# Patient Record
Sex: Male | Born: 1985 | Race: White | Hispanic: No | Marital: Single | Smoking: Never smoker
Health system: Southern US, Community
[De-identification: ages and names within clinical notes are randomized; demographics above are authoritative.]

## PROBLEM LIST (undated history)

## (undated) ENCOUNTER — Ambulatory Visit (HOSPITAL_COMMUNITY): Admission: EM | Payer: BC Managed Care – PPO

## (undated) DIAGNOSIS — N390 Urinary tract infection, site not specified: Secondary | ICD-10-CM

## (undated) DIAGNOSIS — A048 Other specified bacterial intestinal infections: Secondary | ICD-10-CM

## (undated) HISTORY — DX: Urinary tract infection, site not specified: N39.0

## (undated) HISTORY — DX: Other specified bacterial intestinal infections: A04.8

---

## 2018-06-12 ENCOUNTER — Encounter: Payer: Self-pay | Admitting: Urgent Care

## 2018-06-12 ENCOUNTER — Ambulatory Visit (INDEPENDENT_AMBULATORY_CARE_PROVIDER_SITE_OTHER): Payer: 59 | Admitting: Urgent Care

## 2018-06-12 VITALS — BP 151/87 | HR 62 | Temp 98.1°F | Resp 18 | Ht 73.0 in | Wt 215.4 lb

## 2018-06-12 DIAGNOSIS — R0789 Other chest pain: Secondary | ICD-10-CM

## 2018-06-12 DIAGNOSIS — R030 Elevated blood-pressure reading, without diagnosis of hypertension: Secondary | ICD-10-CM | POA: Diagnosis not present

## 2018-06-12 MED ORDER — OMEPRAZOLE 20 MG PO CPDR
20.0000 mg | DELAYED_RELEASE_CAPSULE | Freq: Two times a day (BID) | ORAL | 1 refills | Status: DC
Start: 2018-06-12 — End: 2018-08-22

## 2018-06-12 MED ORDER — RANITIDINE HCL 150 MG PO TABS: 150.0000 mg | ORAL_TABLET | Freq: Two times a day (BID) | ORAL | 0 refills | Status: DC

## 2018-06-12 NOTE — Progress Notes (Signed)
   MRN: [id number] DOB: 12/26/1985  Subjective:   Manuel Smith is a 32 y.o. male presenting for 2 day history of mid-sternal chest pain after laying down. Has been constant, pressure type sensation, warmth but not burning. Does have difficulty taking a deep breath due to pain and headache, difficulty sleeping due to his chest symptoms. Patient moved here from [place] last summer, reports a history of UTI only. Denies smoking cigarettes or drinking alcohol. Denies fever, cough, shob, heart racing, palpitations, n/v, abdominal pain, rashes. Hydrates very well.   Manuel Smith is not currently taking any medications.  Also has No Known Allergies.  Manuel Smith  has a past medical history of UTI (urinary tract infection). Denies past surgical history. Denies family history of heart disease, HTN, HL, abnormal heart rhythms.  Objective:   Vitals: BP (!) 151/87   Pulse 62   Temp 98.1 F (36.7 C) (Oral)   Resp 18   Ht 6\' 1"  (1.854 m)   Wt 215 lb 6.4 oz (97.7 kg)   SpO2 99%   BMI 28.42 kg/m   Physical Exam  Constitutional: He is oriented to person, place, and time. He appears well-developed and well-nourished.  HENT:  Mouth/Throat: Oropharynx is clear and moist.  Eyes: Pupils are equal, round, and reactive to light. EOM are normal. No scleral icterus.  Neck: Normal range of motion. Neck supple. No thyromegaly present.  Cardiovascular: Normal rate, regular rhythm, normal heart sounds and intact distal pulses. Exam reveals no gallop and no friction rub.  No murmur heard. Pulmonary/Chest: Effort normal and breath sounds normal. No stridor. No respiratory distress. He has no wheezes. He has no rales. He exhibits no tenderness.  Abdominal: Soft. Bowel sounds are normal. He exhibits no distension and no mass. There is no tenderness. There is no rebound and no guarding.  Neurological: He is alert and oriented to person, place, and time.  Skin: Skin is warm and dry.  Psychiatric: He has a normal mood and  affect.   ECG interpretation - sinus rhythm at 69bpm.  Assessment and Plan :   Chest tightness - Plan: EKG 12-Lead, CBC, Comprehensive metabolic panel, CANCELED: COMPLETE METABOLIC PANEL WITH GFR, CANCELED: Lipid panel  Atypical chest pain - Plan: H. pylori breath test, TSH  Elevated blood pressure reading  Suspect GERD versus PUD, H. pylori infection. Labs pending. Will have patient start Zantac with Prilosec. Will f/u with the results and treatment plan thereafter. He denies history of HTN, will recheck in 2 weeks.   Manuel BambergMario Rayshad Riviello, PA-C [facility] [phone] 06/12/2018  5:26 PM

## 2018-06-12 NOTE — Patient Instructions (Addendum)
We will start with Zantac and Prilosec together for now. Please sign up for mychart so we can explain in detail what to do once we have the test results back.     Nonspecific Chest Pain Chest pain can be caused by many different conditions. There is always a chance that your pain could be related to something serious, such as a heart attack or a blood clot in your lungs. Chest pain can also be caused by conditions that are not life-threatening. If you have chest pain, it is very important to follow up with your health care provider. What are the causes? Causes of this condition include:  Heartburn.  Pneumonia or bronchitis.  Anxiety or stress.  Inflammation around your heart (pericarditis) or lung (pleuritis or pleurisy).  A blood clot in your lung.  A collapsed lung (pneumothorax). This can develop suddenly on its own (spontaneous pneumothorax) or from trauma to the chest.  Shingles infection (varicella-zoster virus).  Heart attack.  Damage to the bones, muscles, and cartilage that make up your chest wall. This can include: ? Bruised bones due to injury. ? Strained muscles or cartilage due to frequent or repeated coughing or overwork. ? Fracture to one or more ribs. ? Sore cartilage due to inflammation (costochondritis).  What increases the risk? Risk factors for this condition may include:  Activities that increase your risk for trauma or injury to your chest.  Respiratory infections or conditions that cause frequent coughing.  Medical conditions or overeating that can cause heartburn.  Heart disease or family history of heart disease.  Conditions or health behaviors that increase your risk of developing a blood clot.  Having had chicken pox (varicella zoster).  What are the signs or symptoms? Chest pain can feel like:  Burning or tingling on the surface of your chest or deep in your chest.  Crushing, pressure, aching, or squeezing pain.  Dull or sharp pain that  is worse when you move, cough, or take a deep breath.  Pain that is also felt in your back, neck, shoulder, or arm, or pain that spreads to any of these areas.  Your chest pain may come and go, or it may stay constant. How is this diagnosed? Lab tests or other studies may be needed to find the cause of your pain. Your health care provider may have you take a test called an ECG (electrocardiogram). An ECG records your heartbeat patterns at the time the test is performed. You may also have other tests, such as:  Transthoracic echocardiogram (TTE). In this test, sound waves are used to create a picture of the heart structures and to look at how blood flows through your heart.  Transesophageal echocardiogram (TEE).This is a more advanced imaging test that takes images from inside your body. It allows your health care provider to see your heart in finer detail.  Cardiac monitoring. This allows your health care provider to monitor your heart rate and rhythm in real time.  Holter monitor. This is a portable device that records your heartbeat and can help to diagnose abnormal heartbeats. It allows your health care provider to track your heart activity for several days, if needed.  Stress tests. These can be done through exercise or by taking medicine that makes your heart beat more quickly.  Blood tests.  Other imaging tests.  How is this treated? Treatment depends on what is causing your chest pain. Treatment may include:  Medicines. These may include: ? Acid blockers for heartburn. ? Anti-inflammatory  medicine. ? Pain medicine for inflammatory conditions. ? Antibiotic medicine, if an infection is present. ? Medicines to dissolve blood clots. ? Medicines to treat coronary artery disease (CAD).  Supportive care for conditions that do not require medicines. This may include: ? Resting. ? Applying heat or cold packs to injured areas. ? Limiting activities until pain decreases.  Follow  these instructions at home: Medicines  If you were prescribed an antibiotic, take it as told by your health care provider. Do not stop taking the antibiotic even if you start to feel better.  Take over-the-counter and prescription medicines only as told by your health care provider. Lifestyle  Do not use any products that contain nicotine or tobacco, such as cigarettes and e-cigarettes. If you need help quitting, ask your health care provider.  Do not drink alcohol.  Make lifestyle changes as directed by your health care provider. These may include: ? Getting regular exercise. Ask your health care provider to suggest some activities that are safe for you. ? Eating a heart-healthy diet. A registered dietitian can help you to learn healthy eating options. ? Maintaining a healthy weight. ? Managing diabetes, if necessary. ? Reducing stress, such as with yoga or relaxation techniques. General instructions  Avoid any activities that bring on chest pain.  If heartburn is the cause for your chest pain, raise (elevate) the head of your bed about 6 inches (15 cm) by putting blocks under the legs. Sleeping with more pillows does not effectively relieve heartburn because it only changes the position of your head.  Keep all follow-up visits as told by your health care provider. This is important. This includes any further testing if your chest pain does not go away. Contact a health care provider if:  Your chest pain does not go away.  You have a rash with blisters on your chest.  You have a fever.  You have chills. Get help right away if:  Your chest pain is worse.  You have a cough that gets worse, or you cough up blood.  You have severe pain in your abdomen.  You have severe weakness.  You faint.  You have sudden, unexplained chest discomfort.  You have sudden, unexplained discomfort in your arms, back, neck, or jaw.  You have shortness of breath at any time.  You suddenly  start to sweat, or your skin gets clammy.  You feel nauseous or you vomit.  You suddenly feel light-headed or dizzy.  Your heart begins to beat quickly, or it feels like it is skipping beats. These symptoms may represent a serious problem that is an emergency. Do not wait to see if the symptoms will go away. Get medical help right away. Call your local emergency services (911 in the [place] Do not drive yourself to the hospital. This information is not intended to replace advice given to you by your health care provider. Make sure you discuss any questions you have with your health care provider. Document Released: 08/24/2005 Document Revised: 08/08/2016 Document Reviewed: 08/08/2016 Elsevier Interactive Patient Education  2017 [employer]    IF you received an x-ray today, you will receive an invoice from [facility]. Please contact [facility] at [phone] with questions or concerns regarding your invoice.   IF you received labwork today, you will receive an invoice from [facility]. Please contact [facility] at [phone] with questions or concerns regarding your invoice.   Our billing staff will not be able to assist you with questions regarding bills from  these companies.  You will be contacted with the lab results as soon as they are available. The fastest way to get your results is to activate your My Chart account. Instructions are located on the last page of this paperwork. If you have not heard from [place] regarding the results in 2 weeks, please contact this office.

## 2018-06-13 LAB — CBC
Hematocrit: 44.1 % (ref 37.5–51.0)
Hemoglobin: 15 g/dL (ref 13.0–17.7)
MCH: 30.7 pg (ref 26.6–33.0)
MCHC: 34 g/dL (ref 31.5–35.7)
MCV: 90 fL (ref 79–97)
PLATELETS: 243 10*3/uL (ref 150–450)
RBC: 4.88 x10E6/uL (ref 4.14–5.80)
RDW: 13.9 % (ref 12.3–15.4)
WBC: 6.6 10*3/uL (ref 3.4–10.8)

## 2018-06-13 LAB — COMPREHENSIVE METABOLIC PANEL
ALK PHOS: 79 IU/L (ref 39–117)
ALT: 25 IU/L (ref 0–44)
AST: 25 IU/L (ref 0–40)
Albumin/Globulin Ratio: 1.6 (ref 1.2–2.2)
Albumin: 4.5 g/dL (ref 3.5–5.5)
BUN/Creatinine Ratio: 12 (ref 9–20)
BUN: 13 mg/dL (ref 6–20)
Bilirubin Total: 0.4 mg/dL (ref 0.0–1.2)
CO2: 24 mmol/L (ref 20–29)
CREATININE: 1.11 mg/dL (ref 0.76–1.27)
Calcium: 9.3 mg/dL (ref 8.7–10.2)
Chloride: 103 mmol/L (ref 96–106)
GFR calc Af Amer: 101 mL/min/{1.73_m2} (ref >59)
GFR calc non Af Amer: 87 mL/min/{1.73_m2} (ref >59)
GLOBULIN, TOTAL: 2.8 g/dL (ref 1.5–4.5)
Glucose: 92 mg/dL (ref 65–99)
Potassium: 4.2 mmol/L (ref 3.5–5.2)
SODIUM: 144 mmol/L (ref 134–144)
Total Protein: 7.3 g/dL (ref 6.0–8.5)

## 2018-06-13 LAB — H. PYLORI BREATH COLLECTION

## 2018-06-13 LAB — H. PYLORI BREATH TEST: H PYLORI BREATH TEST: POSITIVE — AB

## 2018-06-13 LAB — TSH: TSH: 3.56 u[IU]/mL (ref 0.450–4.500)

## 2018-06-15 ENCOUNTER — Ambulatory Visit: Payer: Self-pay

## 2018-06-15 ENCOUNTER — Ambulatory Visit (INDEPENDENT_AMBULATORY_CARE_PROVIDER_SITE_OTHER): Payer: 59 | Admitting: Urgent Care

## 2018-06-15 ENCOUNTER — Other Ambulatory Visit: Payer: Self-pay

## 2018-06-15 ENCOUNTER — Other Ambulatory Visit: Payer: Self-pay | Admitting: Urgent Care

## 2018-06-15 ENCOUNTER — Encounter: Payer: Self-pay | Admitting: Urgent Care

## 2018-06-15 VITALS — BP 146/74 | HR 83 | Temp 98.4°F | Ht 76.0 in | Wt 212.8 lb

## 2018-06-15 DIAGNOSIS — A048 Other specified bacterial intestinal infections: Secondary | ICD-10-CM | POA: Diagnosis not present

## 2018-06-15 DIAGNOSIS — R030 Elevated blood-pressure reading, without diagnosis of hypertension: Secondary | ICD-10-CM

## 2018-06-15 DIAGNOSIS — R0789 Other chest pain: Secondary | ICD-10-CM | POA: Insufficient documentation

## 2018-06-15 MED ORDER — AMOXICILLIN 500 MG PO CAPS: 1000.0000 mg | ORAL_CAPSULE | Freq: Two times a day (BID) | ORAL | 0 refills | Status: DC

## 2018-06-15 MED ORDER — CLARITHROMYCIN 500 MG PO TABS: 500.0000 mg | ORAL_TABLET | Freq: Two times a day (BID) | ORAL | 0 refills | Status: DC

## 2018-06-15 NOTE — Progress Notes (Signed)
    MRN: [id number] DOB: [birth year]  Subjective:   Manuel Smith is a 32 y.o. male presenting for follow up on his chest pain.  Last office visit was 06/12/2018.  Patient had H. pylori testing done which turned out to be positive.  I sent patient a my chart message recommend he start H. pylori eradication treatment with amoxicillin, clarithromycin and Prilosec.  He has not started this medication.  He is very concerned that his heart is the issue.  All his labs were normal including an EKG from 06/12/2018.  Manuel Smith has a current medication list which includes the following prescription(s): amoxicillin, clarithromycin, omeprazole, and ranitidine. Also has No Known Allergies.  Manuel Smith  has a past medical history of UTI (urinary tract infection). Denies past surgical history.  Objective:   Vitals: BP (!) 146/74 (BP Location: Left Arm, Patient Position: Sitting, Cuff Size: Normal)   Pulse 83   Temp 98.4 F (36.9 C) (Oral)   Ht 6\' 4"  (1.93 m)   Wt 212 lb 12.8 oz (96.5 kg)   SpO2 98%   BMI 25.90 kg/m   BP Readings from Last 3 Encounters:  06/15/18 (!) 146/74  06/12/18 (!) 151/87    Physical Exam  Constitutional: He is oriented to person, place, and time. He appears well-developed and well-nourished.  Cardiovascular: Normal rate.  Pulmonary/Chest: Effort normal.  Neurological: He is alert and oriented to person, place, and time.   Assessment and Plan :   H. pylori infection  Chest tightness  Atypical chest pain  Elevated blood pressure reading  I reviewed all labs with patient again in clinic including his EKG.  Everything was normal except for H. pylori.  Counseled patient that this very well could be the source of his chest discomfort.  I recommended he actually start the eradication treatment with amoxicillin and clarithromycin.  Patient was agreeable to this.  We will follow-up with patient in 4 weeks to see if his symptoms are resolved, consider retesting for H. pylori or  further work-up as needed. Counseled patient on potential for adverse effects with medications prescribed today, patient verbalized understanding.   Wallis BambergMario Jamaris Theard, PA-C [facility] [phone] 06/15/2018 5:18 PM

## 2018-06-15 NOTE — Patient Instructions (Addendum)
In order for us to retest to see if you are healed from your h. Pylori infection, you have to be off of Prilosec for a total of 2 weeks. This can be difficult because Prilosec does help with heartburn and stopping it can make heartburn come back.    Helicobacter Pylori Infection Helicobacter pylori infection is an infection in the stomach that is caused by the Helicobacter pylori (H. pylori) bacteria. This type of bacteria often lives in the lining of the stomach. The infection can cause ulcers and irritation (gastritis) in some people. It is the most common cause of ulcers in the stomach (gastric ulcer) and in the upper part of the intestine (duodenal ulcer). Having this infection may also increase the risk of stomach cancer and a type of white blood cell cancer (lymphoma) that affects the stomach. What are the causes? H. pylori is a type of bacteria that is often found in the stomachs of healthy people. The bacteria may be passed from person to person through contact with stool or saliva. It is not known why some people develop ulcers, gastritis, or cancer from the infection. What increases the risk? This condition is more likely to develop in people who:  Have family members with the infection.  Live with many other people, such as in a dormitory.  Are of African, Hispanic, or Asian descent.  What are the signs or symptoms? Most people with this infection do not have symptoms. If you do have symptoms, they may include:  Heartburn.  Stomach pain.  Nausea.  Vomiting.  Blood-tinged vomit.  Loss of appetite.  Bad breath.  How is this diagnosed? This condition may be diagnosed based on your symptoms, a physical exam, and various tests. Tests may include:  Blood tests or stool tests to check for the proteins (antibodies) that your body may produce in response to the bacteria. These tests are the best way to confirm the diagnosis.  A breath test to check for the type of gas that the  H. pylori bacteria release after breaking down a substance called urea. For the test, you are asked to drink urea. This test is often done after treatment in order to find out if the treatment worked.  A procedure in which a thin, flexible tube with a tiny camera at the end is placed into your stomach and upper intestine (upper endoscopy). Your health care provider may also take tissue samples (biopsy) to test for H. pylori and cancer.  How is this treated? Treatment for this condition usually involves taking a combination of medicines (triple therapy) for a couple of weeks. Triple therapy includes one medicine to reduce the acid in your stomach and two types of antibiotic medicines. Many drug combinations have been approved for treatment. Treatment usually kills the H. pylori and reduces your risk of cancer. You may need to be tested for H. pylori again after treatment. In some cases, the treatment may need to be repeated. Follow these instructions at home:  Take over-the-counter and prescription medicines only as told by your health care provider.  Take your antibiotics as told by your health care provider. Do not stop taking the antibiotics even if you start to feel better.  You can do all your usual activities and eat what you usually do.  Take steps to prevent future infections: ? Wash your hands often. ? Make sure the food you eat has been properly prepared. ? Drink water only from clean sources.  Keep all follow-up visits  as told by your health care provider. This is important. Contact a health care provider if:  Your symptoms do not get better.  Your symptoms return after treatment. This information is not intended to replace advice given to you by your health care provider. Make sure you discuss any questions you have with your health care provider. Document Released: 03/07/2016 Document Revised: 04/21/2016 Document Reviewed: 11/26/2014 Elsevier Interactive Patient Education   2018 [employer].     IF you received an x-ray today, you will receive an invoice from [facility]. Please contact [facility] at [phone] with questions or concerns regarding your invoice.   IF you received labwork today, you will receive an invoice from [facility]. Please contact [facility] at [phone] with questions or concerns regarding your invoice.   Our billing staff will not be able to assist you with questions regarding bills from these companies.  You will be contacted with the lab results as soon as they are available. The fastest way to get your results is to activate your My Chart account. Instructions are located on the last page of this paperwork. If you have not heard from [place] regarding the results in 2 weeks, please contact this office.

## 2018-06-15 NOTE — Telephone Encounter (Signed)
Patient called in and says he was seen on 06/12/18 for chest pain and he is still having the same type of chest pain, nothing different. I asked what made him call in today, he says because he's concerned that it's not better and it makes him feel tired and sleepy. I asked is he taking the medication prescribed on 06/12/18, he says yes.  He says he would like to be seen this afternoon, if possible, to talk to the provider more about what is going on. Appointment scheduled for today at 1700 with Manuel BambergMario Mani, PA.

## 2018-06-18 ENCOUNTER — Encounter: Payer: Self-pay | Admitting: Urgent Care

## 2018-06-18 MED ORDER — AMLODIPINE BESYLATE 5 MG PO TABS: 5.0000 mg | ORAL_TABLET | Freq: Every day | ORAL | 3 refills | Status: DC

## 2018-06-18 NOTE — Telephone Encounter (Signed)
Please advise, thank you.

## 2018-06-18 NOTE — Telephone Encounter (Signed)
Please send this message to [facility] Surgical[facility]ario Smith.

## 2018-06-18 NOTE — Telephone Encounter (Signed)
BP Readings from Last 3 Encounters:  06/15/18 (!) 146/74  06/12/18 (!) 151/87   We will have patient start a low-dose of amlodipine at 5 mg once daily.

## 2018-06-18 NOTE — Telephone Encounter (Signed)
Please let patient know that we can repeat his ecg, get a chest x-ray, order more tests if he would like to do this. I do not mind look for other sources of his chest pain. He would just have to set up another office visit. Let me know what patient would like or simply redirect him toward the scheduling pool if he wants to look at other sources of his chest pain.   Thank you!

## 2018-06-18 NOTE — Telephone Encounter (Signed)
Will communicate with patient through mychart.

## 2018-06-21 ENCOUNTER — Ambulatory Visit (INDEPENDENT_AMBULATORY_CARE_PROVIDER_SITE_OTHER): Payer: 59

## 2018-06-21 ENCOUNTER — Ambulatory Visit (INDEPENDENT_AMBULATORY_CARE_PROVIDER_SITE_OTHER): Payer: 59 | Admitting: Urgent Care

## 2018-06-21 ENCOUNTER — Encounter: Payer: Self-pay | Admitting: Urgent Care

## 2018-06-21 ENCOUNTER — Telehealth: Payer: Self-pay | Admitting: Urgent Care

## 2018-06-21 VITALS — BP 121/74 | HR 106 | Temp 98.6°F | Resp 16 | Ht 76.0 in | Wt 209.0 lb

## 2018-06-21 DIAGNOSIS — R079 Chest pain, unspecified: Secondary | ICD-10-CM | POA: Diagnosis not present

## 2018-06-21 DIAGNOSIS — R000 Tachycardia, unspecified: Secondary | ICD-10-CM | POA: Diagnosis not present

## 2018-06-21 DIAGNOSIS — R0789 Other chest pain: Secondary | ICD-10-CM

## 2018-06-21 DIAGNOSIS — G4700 Insomnia, unspecified: Secondary | ICD-10-CM | POA: Diagnosis not present

## 2018-06-21 LAB — TROPONIN I: Troponin I: <0.01 ng/mL (ref 0.00–0.04)

## 2018-06-21 LAB — D-DIMER, QUANTITATIVE: D-DIMER: 0.22 mg/L FEU (ref 0.00–0.49)

## 2018-06-21 MED ORDER — HYDROXYZINE HCL 25 MG PO TABS: 25.0000 mg | ORAL_TABLET | Freq: Every evening | ORAL | 0 refills | Status: DC | PRN

## 2018-06-21 NOTE — Telephone Encounter (Signed)
Returning call Copied from CRM [phone]. Topic: Quick Communication - Lab Results >> Jun 21, 2018  3:00 PM Cleophus MoltGoodwin, Tasha K, CMA wrote: Called patient to inform them of  lab results. When patient returns call, triage nurse may disclose results as follows: (do not send a letter) let him know that his d-dimer test was completely normal and given his risk factors is highly unlikely to be undergoing any chest clot as a source of his pain.  His troponin marker was also negative and this together with a couple of EKGs that have been completely normal means that he does not have a heart attack.  If you would like me to refer him to a cardiologist, please let me know and I will gladly do so.  But I think it would be best to refer him to a gastroenterologist if his symptoms are persisting despite treatment for H. pylori.

## 2018-06-21 NOTE — Progress Notes (Signed)
MRN: [id number] DOB: December 21, 1985  Subjective:   Manuel Smith is a 32 y.o. male presenting for follow up on persistent epigastric to midsternal chest pain.  Patient has had this for several weeks now.  Reports that it is getting worse including feeling like he cannot take a deep breath, does not let him sleep at night and feels heart racing.  He is currently taking medications for H. pylori eradication.  We have also started him on amlodipine for managing his high blood pressure.  Denies fever, radiation of his chest pain, nausea, vomiting, diaphoresis, palpitations, dizziness, shortness of breath.  Patient is not a smoker.  He does not drink strong caffeine drinks.  He limits his coffee to one cup per day.  He is very worried about his heart.  Denies smoking cigarettes.  Manuel Smith has a current medication list which includes the following prescription(s): amlodipine, amoxicillin, clarithromycin, omeprazole, and ranitidine. Also has No Known Allergies.  Manuel Smith  has a past medical history of UTI (urinary tract infection). Denies past surgical history. Denies family history of cancer, diabetes, HTN, HL, heart disease, stroke, mental illness.   Objective:   Vitals: BP 121/74   Pulse (!) 106   Temp 98.6 F (37 C)   Resp 16   Ht 6\' 4"  (1.93 m)   Wt 209 lb (94.8 kg)   SpO2 99%   BMI 25.44 kg/m   BP Readings from Last 3 Encounters:  06/21/18 121/74  06/15/18 (!) 146/74  06/12/18 (!) 151/87    Physical Exam  Constitutional: He is oriented to person, place, and time. He appears well-developed and well-nourished.  HENT:  Mouth/Throat: Oropharynx is clear and moist.  Eyes: Right eye exhibits no discharge. Left eye exhibits no discharge. No scleral icterus.  Neck: Normal range of motion. Neck supple. No thyromegaly present.  Cardiovascular: Normal rate, regular rhythm, normal heart sounds and intact distal pulses. Exam reveals no gallop and no friction rub.  No murmur  heard. Pulmonary/Chest: Effort normal and breath sounds normal. No stridor. No respiratory distress. He has no wheezes. He has no rales. He exhibits no tenderness.  Neurological: He is alert and oriented to person, place, and time.  Skin: Skin is warm and dry.  Psychiatric:  Patient appears very anxious about his symptoms.   Dg Chest 2 View  Result Date: 06/21/2018 CLINICAL DATA:  32 year old male with chest pain.  Tachycardia. EXAM: CHEST - 2 VIEW COMPARISON:  None. FINDINGS: Lung volumes are compatible with good inspiratory effort. Normal cardiac size and mediastinal contours. Visualized tracheal air column is within normal limits. Both lungs appear clear. No pneumothorax or pleural effusion. No osseous abnormality identified. Negative visible bowel gas pattern. IMPRESSION: Negative. Electronically Signed   By: Odessa Fleming M.D.   On: 06/21/2018 11:15   ECG interpretation - sinus rhythm at 99bpm.  Assessment and Plan :   Atypical chest pain - Plan: DG Chest 2 View, EKG 12-Lead, Sedimentation Rate, D-dimer, quantitative (not at [facility]), Troponin I, Uric A+ANA+RA Qn+CRP+ASO  Chest tightness  Racing heart beat  Insomnia, unspecified type  Radiology report, EKG and physical exam findings very reassuring.  Labs pending.  Encourage patient to maintain his H. pylori treatment.  Will restart Zantac in case Prilosec is not helping with any acid reflux component.  Will start Vistaril to help patient with his sleeping and anxiety.  Consider referral to GI for an endoscopy if symptoms persist and lab tests are negative.  Wallis Bamberg, PA-C [facility] [phone] 06/21/2018 11:00 AM

## 2018-06-21 NOTE — Addendum Note (Signed)
Addended by: Rogelia RohrerMCADOO, Emerson Barretto K on: 06/21/2018 05:44 PM   Modules accepted: Orders

## 2018-06-21 NOTE — Patient Instructions (Addendum)
Helicobacter Pylori Infection Helicobacter pylori infection is an infection in the stomach that is caused by the Helicobacter pylori (H. pylori) bacteria. This type of bacteria often lives in the lining of the stomach. The infection can cause ulcers and irritation (gastritis) in some people. It is the most common cause of ulcers in the stomach (gastric ulcer) and in the upper part of the intestine (duodenal ulcer). Having this infection may also increase the risk of stomach cancer and a type of white blood cell cancer (lymphoma) that affects the stomach. What are the causes? H. pylori is a type of bacteria that is often found in the stomachs of healthy people. The bacteria may be passed from person to person through contact with stool or saliva. It is not known why some people develop ulcers, gastritis, or cancer from the infection. What increases the risk? This condition is more likely to develop in people who:  Have family members with the infection.  Live with many other people, such as in a dormitory.  Are of African, Hispanic, or Asian descent.  What are the signs or symptoms? Most people with this infection do not have symptoms. If you do have symptoms, they may include:  Heartburn.  Stomach pain.  Nausea.  Vomiting.  Blood-tinged vomit.  Loss of appetite.  Bad breath.  How is this diagnosed? This condition may be diagnosed based on your symptoms, a physical exam, and various tests. Tests may include:  Blood tests or stool tests to check for the proteins (antibodies) that your body may produce in response to the bacteria. These tests are the best way to confirm the diagnosis.  A breath test to check for the type of gas that the H. pylori bacteria release after breaking down a substance called urea. For the test, you are asked to drink urea. This test is often done after treatment in order to find out if the treatment worked.  A procedure in which a thin, flexible tube with  a tiny camera at the end is placed into your stomach and upper intestine (upper endoscopy). Your health care provider may also take tissue samples (biopsy) to test for H. pylori and cancer.  How is this treated? Treatment for this condition usually involves taking a combination of medicines (triple therapy) for a couple of weeks. Triple therapy includes one medicine to reduce the acid in your stomach and two types of antibiotic medicines. Many drug combinations have been approved for treatment. Treatment usually kills the H. pylori and reduces your risk of cancer. You may need to be tested for H. pylori again after treatment. In some cases, the treatment may need to be repeated. Follow these instructions at home:  Take over-the-counter and prescription medicines only as told by your health care provider.  Take your antibiotics as told by your health care provider. Do not stop taking the antibiotics even if you start to feel better.  You can do all your usual activities and eat what you usually do.  Take steps to prevent future infections: ? Wash your hands often. ? Make sure the food you eat has been properly prepared. ? Drink water only from clean sources.  Keep all follow-up visits as told by your health care provider. This is important. Contact a health care provider if:  Your symptoms do not get better.  Your symptoms return after treatment. This information is not intended to replace advice given to you by your health care provider. Make sure you discuss any questions   you have with your health care provider. Document Released: 03/07/2016 Document Revised: 04/21/2016 Document Reviewed: 11/26/2014 Elsevier Interactive Patient Education  2018 Elsevier Inc.     IF you received an x-ray today, you will receive an invoice from [facility]. Please contact [facility] at [phone] with questions or concerns regarding your invoice.   IF you received labwork today,  you will receive an invoice from [facility]. Please contact [facility] at [phone] with questions or concerns regarding your invoice.   Our billing staff will not be able to assist you with questions regarding bills from these companies.  You will be contacted with the lab results as soon as they are available. The fastest way to get your results is to activate your My Chart account. Instructions are located on the last page of this paperwork. If you have not heard from us regarding the results in 2 weeks, please contact this office.      

## 2018-06-22 LAB — URIC A+ANA+RA QN+CRP+ASO
ANA: NEGATIVE
ASO: 342 [IU]/mL — AB (ref 0.0–200.0)
URIC ACID: 5 mg/dL (ref 3.7–8.6)

## 2018-06-22 LAB — SEDIMENTATION RATE: SED RATE: 2 mm/h (ref 0–15)

## 2018-06-22 NOTE — Telephone Encounter (Signed)
Information sent to patient via mychart from provider.  See result note.

## 2018-06-26 ENCOUNTER — Ambulatory Visit: Payer: 59 | Admitting: Urgent Care

## 2018-07-13 ENCOUNTER — Ambulatory Visit: Payer: 59 | Admitting: Emergency Medicine

## 2018-08-22 ENCOUNTER — Encounter: Payer: Self-pay | Admitting: Family Medicine

## 2018-08-22 ENCOUNTER — Other Ambulatory Visit: Payer: Self-pay

## 2018-08-22 ENCOUNTER — Ambulatory Visit (INDEPENDENT_AMBULATORY_CARE_PROVIDER_SITE_OTHER): Payer: 59 | Admitting: Family Medicine

## 2018-08-22 VITALS — BP 125/80 | HR 75 | Temp 98.7°F | Ht 77.5 in | Wt 205.8 lb

## 2018-08-22 DIAGNOSIS — Z1329 Encounter for screening for other suspected endocrine disorder: Secondary | ICD-10-CM | POA: Diagnosis not present

## 2018-08-22 DIAGNOSIS — Z13220 Encounter for screening for lipoid disorders: Secondary | ICD-10-CM

## 2018-08-22 DIAGNOSIS — Z23 Encounter for immunization: Secondary | ICD-10-CM

## 2018-08-22 DIAGNOSIS — Z114 Encounter for screening for human immunodeficiency virus [HIV]: Secondary | ICD-10-CM | POA: Diagnosis not present

## 2018-08-22 DIAGNOSIS — Z0000 Encounter for general adult medical examination without abnormal findings: Secondary | ICD-10-CM

## 2018-08-22 DIAGNOSIS — Z130 Encounter for screening for diseases of the blood and blood-forming organs and certain disorders involving the immune mechanism: Secondary | ICD-10-CM

## 2018-08-22 DIAGNOSIS — Z13228 Encounter for screening for other metabolic disorders: Secondary | ICD-10-CM

## 2018-08-22 NOTE — Patient Instructions (Addendum)
   If you have lab work done today you will be contacted with your lab results within the next 2 weeks.  If you have not heard from us then please contact us. The fastest way to get your results is to register for My Chart.   IF you received an x-ray today, you will receive an invoice from [facility]. Please contact [facility] at [phone] with questions or concerns regarding your invoice.   IF you received labwork today, you will receive an invoice from [facility]. Please contact [facility] at [phone] with questions or concerns regarding your invoice.   Our billing staff will not be able to assist you with questions regarding bills from these companies.  You will be contacted with the lab results as soon as they are available. The fastest way to get your results is to activate your My Chart account. Instructions are located on the last page of this paperwork. If you have not heard from us regarding the results in 2 weeks, please contact this office.     Preventive Care 18-39 Years, Male Preventive care refers to lifestyle choices and visits with your health care provider that can promote health and wellness. What does preventive care include?  A yearly physical exam. This is also called an annual well check.  Dental exams once or twice a year.  Routine eye exams. Ask your health care provider how often you should have your eyes checked.  Personal lifestyle choices, including: ? Daily care of your teeth and gums. ? Regular physical activity. ? Eating a healthy diet. ? Avoiding tobacco and drug use. ? Limiting alcohol use. ? Practicing safe sex. What happens during an annual well check? The services and screenings done by your health care provider during your annual well check will depend on your age, overall health, lifestyle risk factors, and family history of disease. Counseling Your health care provider may ask you questions about your:  Alcohol  use.  Tobacco use.  Drug use.  Emotional well-being.  Home and relationship well-being.  Sexual activity.  Eating habits.  Work and work environment.  Screening You may have the following tests or measurements:  Height, weight, and BMI.  Blood pressure.  Lipid and cholesterol levels. These may be checked every 5 years starting at age 20.  Diabetes screening. This is done by checking your blood sugar (glucose) after you have not eaten for a while (fasting).  Skin check.  Hepatitis C blood test.  Hepatitis B blood test.  Sexually transmitted disease (STD) testing.  Discuss your test results, treatment options, and if necessary, the need for more tests with your health care provider. Vaccines Your health care provider may recommend certain vaccines, such as:  Influenza vaccine. This is recommended every year.  Tetanus, diphtheria, and acellular pertussis (Tdap, Td) vaccine. You may need a Td booster every 10 years.  Varicella vaccine. You may need this if you have not been vaccinated.  HPV vaccine. If you are 26 or younger, you may need three doses over 6 months.  Measles, mumps, and rubella (MMR) vaccine. You may need at least one dose of MMR.You may also need a second dose.  Pneumococcal 13-valent conjugate (PCV13) vaccine. You may need this if you have certain conditions and have not been vaccinated.  Pneumococcal polysaccharide (PPSV23) vaccine. You may need one or two doses if you smoke cigarettes or if you have certain conditions.  Meningococcal vaccine. One dose is recommended if you are age 19-21 years   and a first-year college student living in a residence hall, or if you have one of several medical conditions. You may also need additional booster doses.  Hepatitis A vaccine. You may need this if you have certain conditions or if you travel or work in places where you may be exposed to hepatitis A.  Hepatitis B vaccine. You may need this if you have  certain conditions or if you travel or work in places where you may be exposed to hepatitis B.  Haemophilus influenzae type b (Hib) vaccine. You may need this if you have certain risk factors.  Talk to your health care provider about which screenings and vaccines you need and how often you need them. This information is not intended to replace advice given to you by your health care provider. Make sure you discuss any questions you have with your health care provider. Document Released: 01/10/2002 Document Revised: 08/03/2016 Document Reviewed: 09/15/2015 Elsevier Interactive Patient Education  2018 Elsevier Inc.  

## 2018-08-22 NOTE — Progress Notes (Signed)
9/25/20199:43 AM  Manuel Smith 1986/11/19, 32 y.o. male [id number]  Chief Complaint  Patient presents with  . Annual Exam    for medical insurance purposes    HPI:   Patient is a 32 y.o. 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 No No No     Depression screen [facility] 2/9 08/22/2018 06/21/2018 06/15/2018  Decreased Interest 0 2 0  Down, Depressed, Hopeless 0 1 0  PHQ - 2 Score 0 3 0  Altered sleeping - 3 -  Tired, decreased energy - 3 -  Change in appetite - 2 -  Feeling bad or failure about yourself  - 1 -  Trouble concentrating - 1 -  Moving slowly or fidgety/restless - 2 -  Suicidal thoughts - 1 -  PHQ-9 Score - 16 -  Difficult doing work/chores - Somewhat difficult -    No Known Allergies  Prior to Admission medications   Medication Sig Start Date End Date Taking? Authorizing Provider    Past Medical History:  Diagnosis Date  . H. pylori infection   . UTI (urinary tract infection)     History reviewed. No pertinent surgical history.  Social History   Tobacco Use  . Smoking status: Never Smoker  . Smokeless tobacco: Never Used  Substance Use Topics  . Alcohol use: Never    Frequency: Never    Family History  Problem Relation Age of Onset  . Cancer Mother     Review of Systems  Constitutional: Negative for chills and fever.  Respiratory: Negative for cough and shortness of  breath.   Cardiovascular: Negative for chest pain, palpitations and leg swelling.  Gastrointestinal: Negative for abdominal pain, nausea and vomiting.  All other systems reviewed and are negative.    OBJECTIVE:  Blood pressure 125/80, pulse 75, temperature 98.7 F (37.1 C), temperature source Oral, height 6' 5.5" (1.969 m), weight 205 lb 12.8 oz (93.4 kg), SpO2 97 %. Body mass index is 24.09 kg/m.    Visual Acuity Screening   Right eye Left eye Both eyes  Without correction: 20/13 20/13 20/13   With correction:       Physical Exam  Constitutional: He is oriented to person, place, and time. He appears well-developed and well-nourished.  HENT:  Head: Normocephalic and atraumatic.  Right Ear: Hearing, tympanic membrane, external ear and ear canal normal.  Left Ear: Hearing, tympanic membrane, external ear and ear canal normal.  Mouth/Throat: Oropharynx is clear and moist. No oropharyngeal exudate.  Eyes: Pupils are equal, round, and reactive to light. Conjunctivae and EOM are normal.  Neck: Neck supple. No thyromegaly present.  Cardiovascular: Normal rate, regular rhythm, normal heart sounds and intact distal pulses. Exam reveals no gallop and no friction rub.  No murmur heard. Pulmonary/Chest: Effort normal and breath sounds normal. He has no wheezes. He has no rhonchi. He  has no rales.  Abdominal: Soft. Bowel sounds are normal. He exhibits no distension and no mass. There is no tenderness.  Musculoskeletal: Normal range of motion. He exhibits no edema.  Lymphadenopathy:    He has no cervical adenopathy.  Neurological: He is alert and oriented to person, place, and time. He has normal strength and normal reflexes. No cranial nerve deficit. Coordination and gait normal.  Skin: Skin is warm and dry.  Psychiatric: He has a normal mood and affect.  Nursing note and vitals reviewed.   ASSESSMENT and PLAN  1. Annual physical exam No concerns per history or exam. Routine HCM labs  ordered. HCM reviewed/discussed. Anticipatory guidance regarding healthy weight, lifestyle and choices given.   2. Need for prophylactic vaccination and inoculation against influenza - Flu Vaccine QUAD 36+ mos IM  3. Screening for lipid disorders - Lipid panel  4. Screening for endocrine, metabolic and immunity disorder - Basic Metabolic Panel  5. Screening for HIV (human immunodeficiency virus) - HIV Antibody (routine testing w rflx)    Return in about 1 year (around 08/23/2019) for CPE.    Myles Lipps, MD Primary Care at [facility] [address] [place], [place] [address] Ph.  [phone] Fax [phone]

## 2018-08-23 LAB — LIPID PANEL
Chol/HDL Ratio: 3.3 ratio (ref 0.0–5.0)
Cholesterol, Total: 168 mg/dL (ref 100–199)
HDL: 51 mg/dL (ref >39)
LDL Calculated: 96 mg/dL (ref 0–99)
Triglycerides: 105 mg/dL (ref 0–149)
VLDL Cholesterol Cal: 21 mg/dL (ref 5–40)

## 2018-08-23 LAB — BASIC METABOLIC PANEL
BUN/Creatinine Ratio: 18 (ref 9–20)
BUN: 16 mg/dL (ref 6–20)
CO2: 25 mmol/L (ref 20–29)
Calcium: 9.8 mg/dL (ref 8.7–10.2)
Chloride: 103 mmol/L (ref 96–106)
Creatinine, Ser: 0.9 mg/dL (ref 0.76–1.27)
GFR calc Af Amer: 130 mL/min/{1.73_m2} (ref >59)
GFR calc non Af Amer: 113 mL/min/{1.73_m2} (ref >59)
Glucose: 87 mg/dL (ref 65–99)
Potassium: 4.6 mmol/L (ref 3.5–5.2)
Sodium: 143 mmol/L (ref 134–144)

## 2018-08-23 LAB — HIV ANTIBODY (ROUTINE TESTING W REFLEX): HIV Screen 4th Generation wRfx: NONREACTIVE

## 2018-08-24 ENCOUNTER — Telehealth: Payer: Self-pay | Admitting: Family Medicine

## 2018-08-24 NOTE — Telephone Encounter (Signed)
Pt came in to get lab results from his CPE.  I advised him that it typically takes 1-2 weeks for lab results to come in.  Can Dr. review?  Pt needs results by the end of the month.  Thanks !

## 2018-08-25 ENCOUNTER — Telehealth: Payer: Self-pay

## 2018-08-25 NOTE — Telephone Encounter (Signed)
Form completed. Patient notified ready for pickup

## 2018-08-25 NOTE — Telephone Encounter (Signed)
Pt called, informed pyhsical form is ready to be picked up

## 2018-12-11 DIAGNOSIS — J101 Influenza due to other identified influenza virus with other respiratory manifestations: Secondary | ICD-10-CM | POA: Diagnosis not present

## 2019-02-16 IMAGING — DX DG CHEST 2V
3 series · 3 of 3 positions shown · non-contrast
Comparison: None.

CLINICAL DATA: 32-year-old male with chest pain.  Tachycardia.

EXAM:
CHEST - 2 VIEW

[chest pa (1 of 2)]
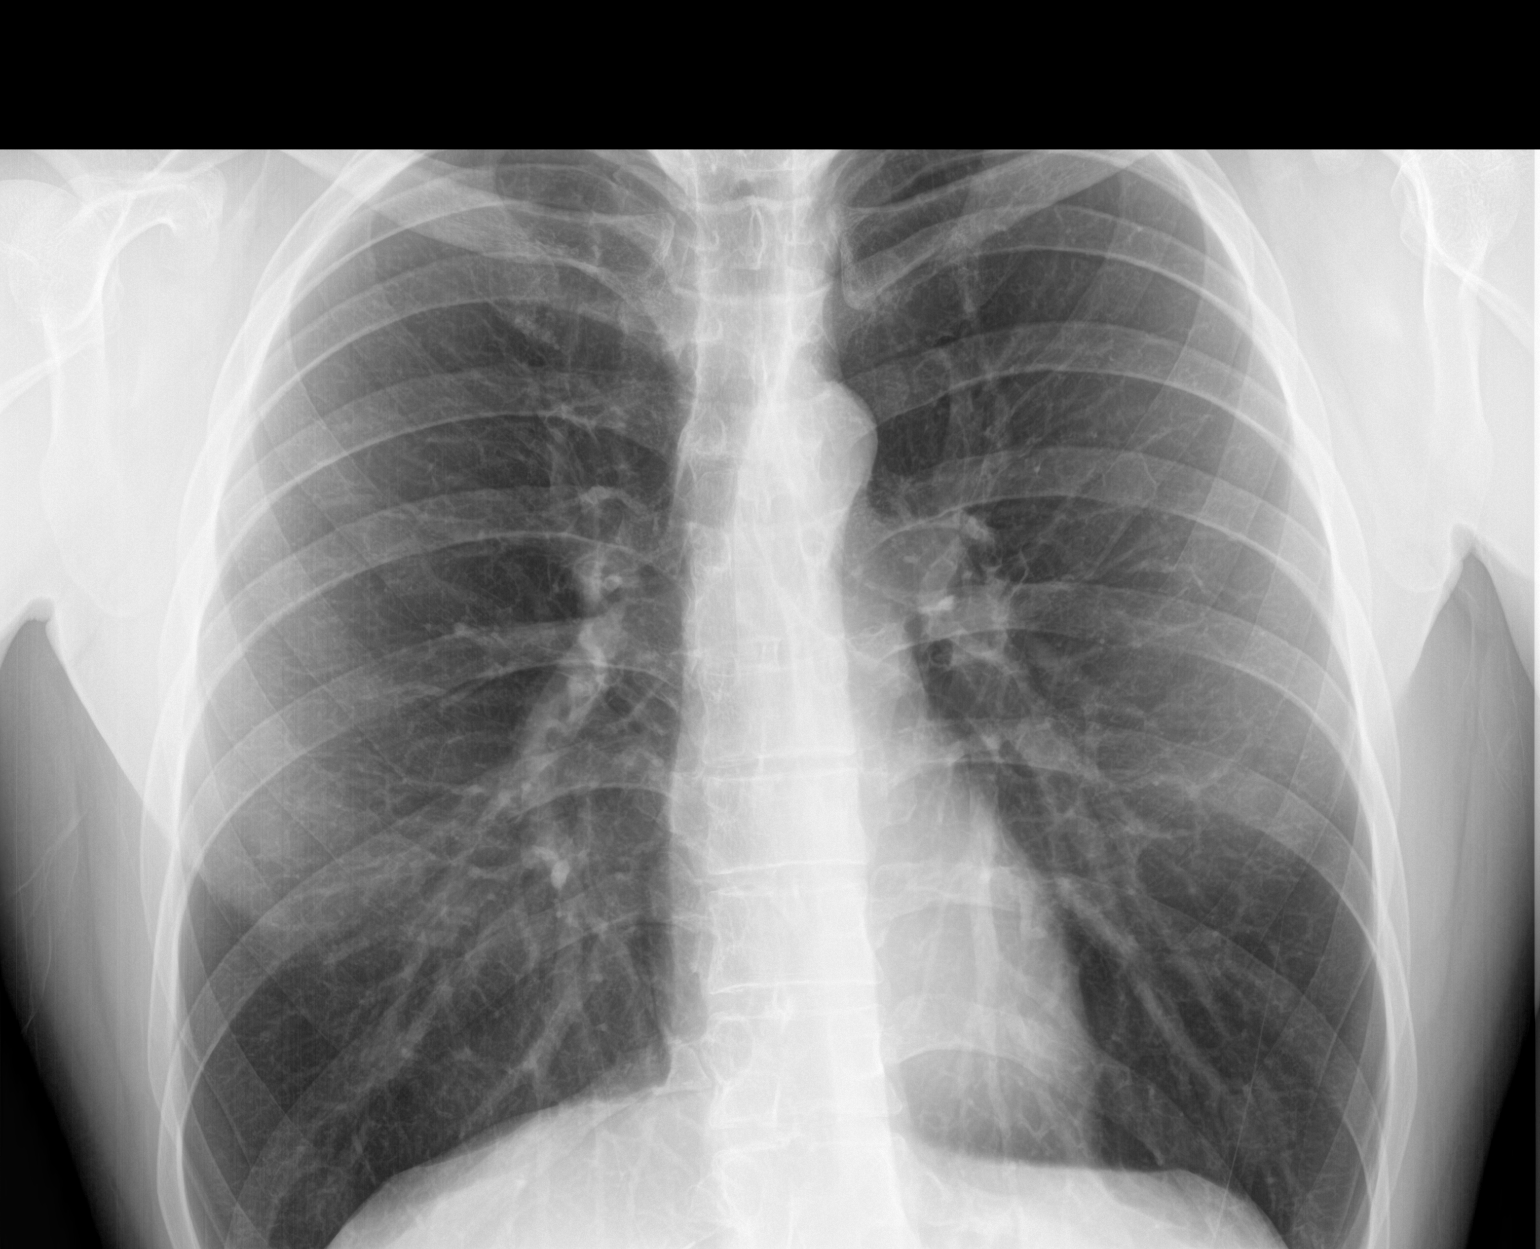

[chest lat]
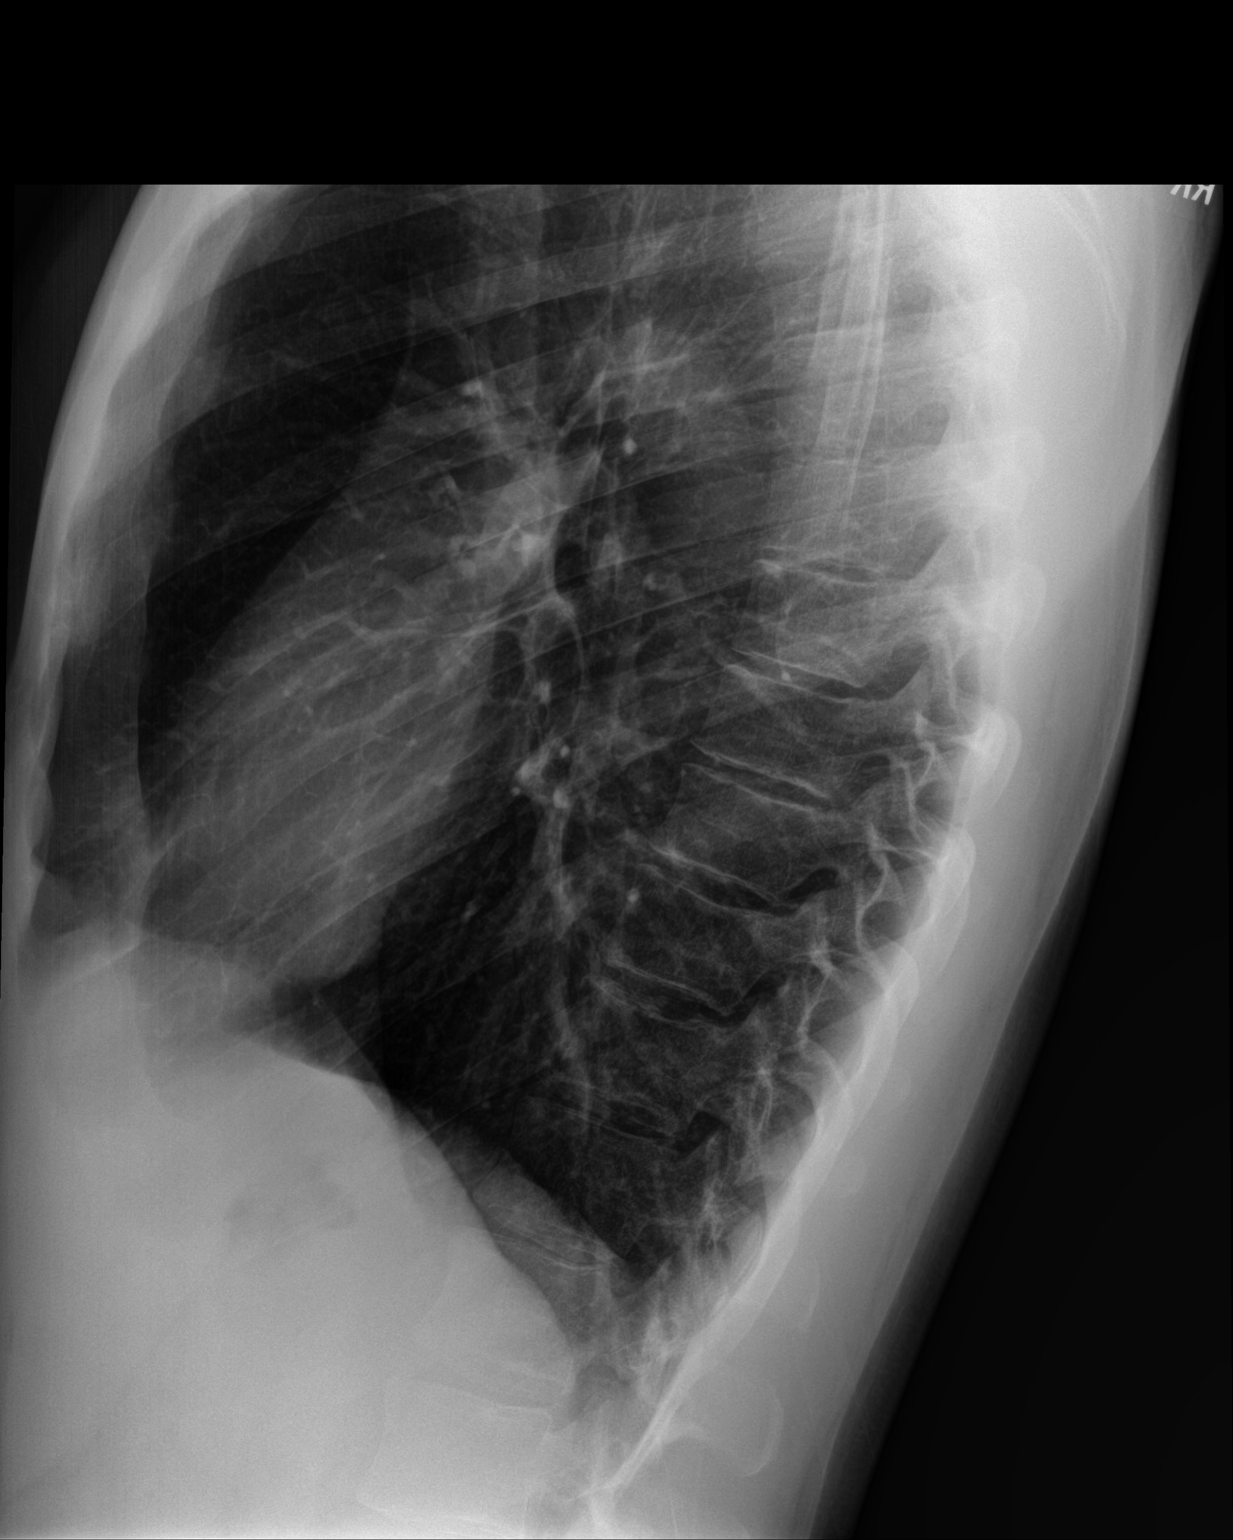

[chest pa (2 of 2)]
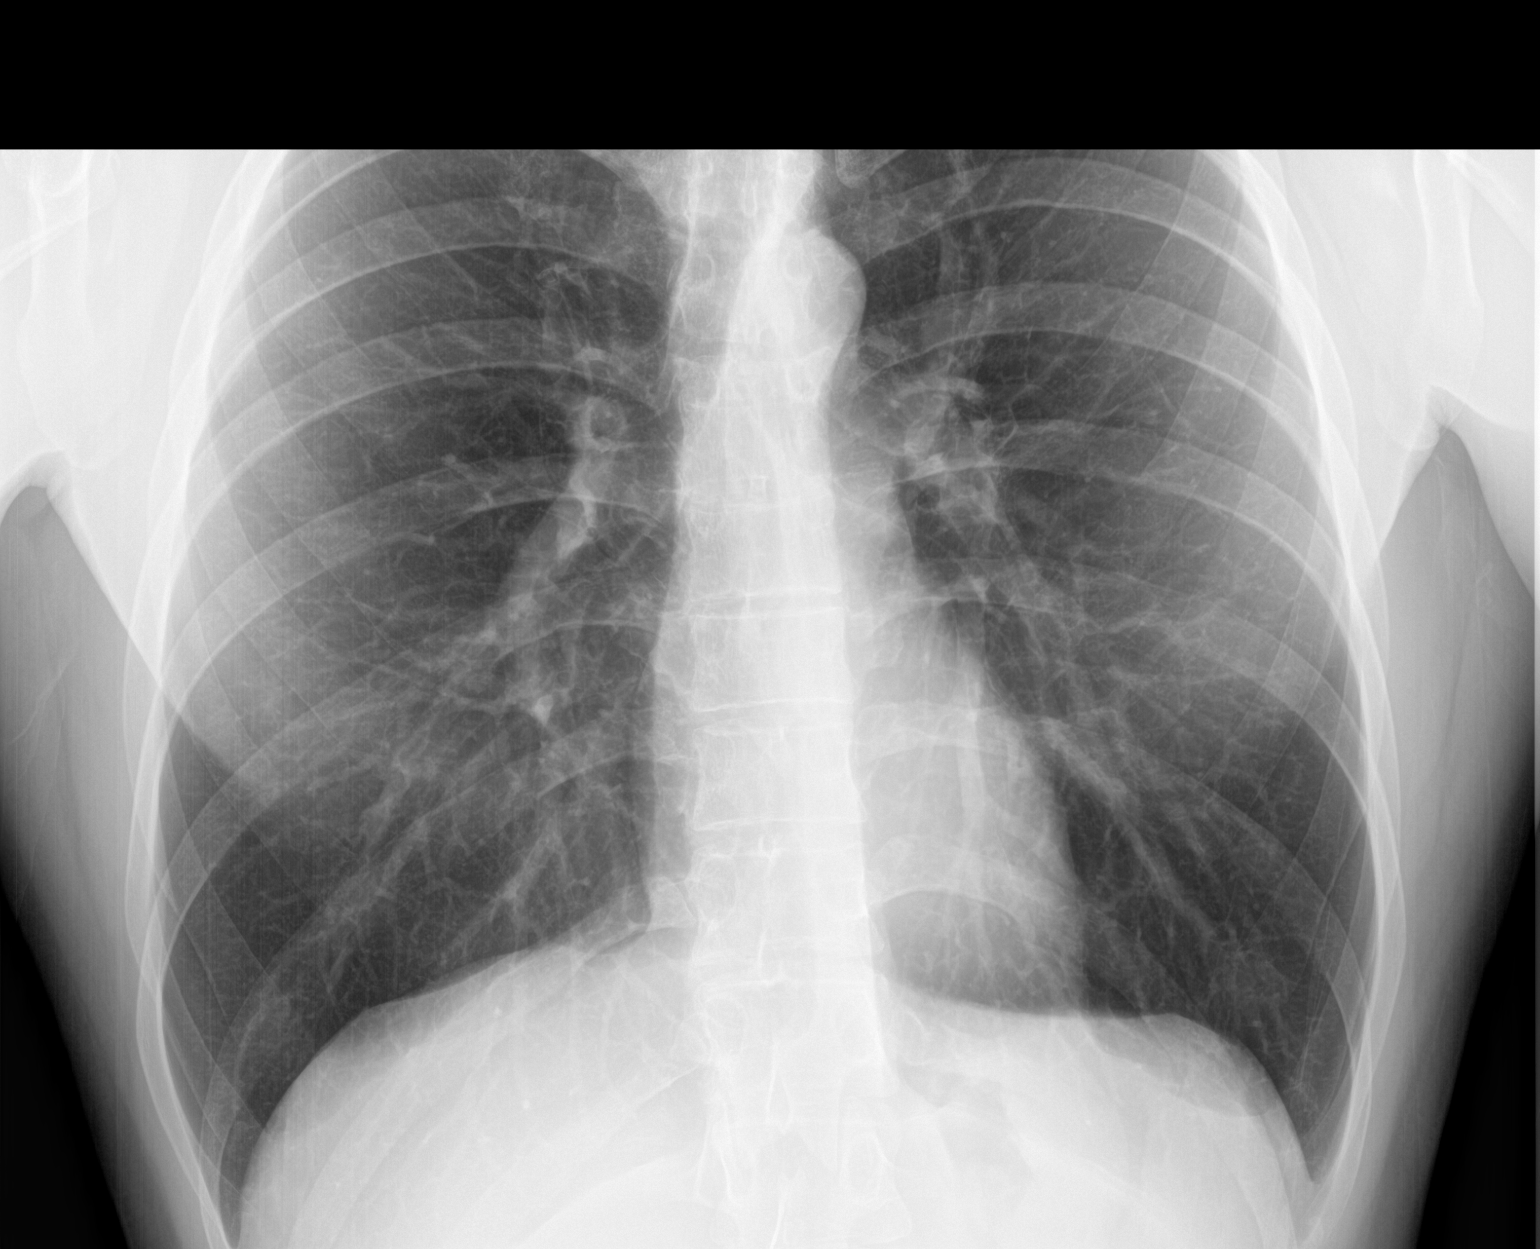

[3 of 3 positions shown; findings below may reference images not displayed]

FINDINGS: Lung volumes are compatible with good inspiratory effort. Normal
cardiac size and mediastinal contours. Visualized tracheal air
column is within normal limits. Both lungs appear clear. No
pneumothorax or pleural effusion. No osseous abnormality identified.
Negative visible bowel gas pattern.
IMPRESSION: Negative.

## 2019-08-29 ENCOUNTER — Ambulatory Visit (INDEPENDENT_AMBULATORY_CARE_PROVIDER_SITE_OTHER): Payer: 59 | Admitting: Family Medicine

## 2019-08-29 ENCOUNTER — Encounter: Payer: Self-pay | Admitting: Family Medicine

## 2019-08-29 ENCOUNTER — Other Ambulatory Visit: Payer: Self-pay

## 2019-08-29 VITALS — BP 146/89 | HR 82 | Temp 98.6°F | Ht 76.0 in | Wt 198.5 lb

## 2019-08-29 DIAGNOSIS — J069 Acute upper respiratory infection, unspecified: Secondary | ICD-10-CM | POA: Insufficient documentation

## 2019-08-29 DIAGNOSIS — Z23 Encounter for immunization: Secondary | ICD-10-CM | POA: Diagnosis not present

## 2019-08-29 DIAGNOSIS — Z0001 Encounter for general adult medical examination with abnormal findings: Secondary | ICD-10-CM | POA: Diagnosis not present

## 2019-08-29 DIAGNOSIS — A048 Other specified bacterial intestinal infections: Secondary | ICD-10-CM

## 2019-08-29 DIAGNOSIS — R5383 Other fatigue: Secondary | ICD-10-CM

## 2019-08-29 DIAGNOSIS — R1012 Left upper quadrant pain: Secondary | ICD-10-CM | POA: Diagnosis not present

## 2019-08-29 DIAGNOSIS — Z13220 Encounter for screening for lipoid disorders: Secondary | ICD-10-CM

## 2019-08-29 DIAGNOSIS — G4726 Circadian rhythm sleep disorder, shift work type: Secondary | ICD-10-CM | POA: Insufficient documentation

## 2019-08-29 DIAGNOSIS — Z114 Encounter for screening for human immunodeficiency virus [HIV]: Secondary | ICD-10-CM

## 2019-08-29 LAB — CBC
HCT: 43.4 % (ref 39.0–52.0)
Hemoglobin: 14.8 g/dL (ref 13.0–17.0)
MCHC: 34.1 g/dL (ref 30.0–36.0)
MCV: 90.8 fl (ref 78.0–100.0)
Platelets: 256 10*3/uL (ref 150.0–400.0)
RBC: 4.78 Mil/uL (ref 4.22–5.81)
RDW: 13.3 % (ref 11.5–15.5)
WBC: 5.3 10*3/uL (ref 4.0–10.5)

## 2019-08-29 LAB — LIPID PANEL
Cholesterol: 143 mg/dL (ref 0–200)
HDL: 46.9 mg/dL (ref >39.00)
LDL Cholesterol: 77 mg/dL (ref 0–99)
NonHDL: 96.15
Total CHOL/HDL Ratio: 3
Triglycerides: 97 mg/dL (ref 0.0–149.0)
VLDL: 19.4 mg/dL (ref 0.0–40.0)

## 2019-08-29 LAB — COMPREHENSIVE METABOLIC PANEL
ALT: 12 U/L (ref 0–53)
AST: 16 U/L (ref 0–37)
Albumin: 4.7 g/dL (ref 3.5–5.2)
Alkaline Phosphatase: 70 U/L (ref 39–117)
BUN: 18 mg/dL (ref 6–23)
CO2: 31 mEq/L (ref 19–32)
Calcium: 10.1 mg/dL (ref 8.4–10.5)
Chloride: 101 mEq/L (ref 96–112)
Creatinine, Ser: 0.87 mg/dL (ref 0.40–1.50)
GFR: 100.74 mL/min (ref >60.00)
Glucose, Bld: 96 mg/dL (ref 70–99)
Potassium: 4.2 mEq/L (ref 3.5–5.1)
Sodium: 140 mEq/L (ref 135–145)
Total Bilirubin: 0.6 mg/dL (ref 0.2–1.2)
Total Protein: 7.5 g/dL (ref 6.0–8.3)

## 2019-08-29 LAB — TSH: TSH: 5.04 u[IU]/mL — ABNORMAL HIGH (ref 0.35–4.50)

## 2019-08-29 LAB — VITAMIN D 25 HYDROXY (VIT D DEFICIENCY, FRACTURES): VITD: 21.97 ng/mL — ABNORMAL LOW (ref 30.00–100.00)

## 2019-08-29 LAB — VITAMIN B12: Vitamin B-12: 278 pg/mL (ref 211–911)

## 2019-08-29 LAB — TESTOSTERONE: Testosterone: 321.38 ng/dL (ref 300.00–890.00)

## 2019-08-29 LAB — LIPASE: Lipase: 33 U/L (ref 11.0–59.0)

## 2019-08-29 NOTE — Assessment & Plan Note (Signed)
Recommended melatonin 5 to 10 mg daily as needed.

## 2019-08-29 NOTE — Assessment & Plan Note (Signed)
Check CBC.  Recommended supplementation with vitamin C and zinc.

## 2019-08-29 NOTE — Progress Notes (Signed)
Chief Complaint:  Manuel Smith is a 33 y.o. male who presents today for his annual comprehensive physical exam and to establish care.   Assessment/Plan:  Recurrent URI (upper respiratory infection) Check CBC.  Recommended supplementation with vitamin C and zinc.  Left upper quadrant abdominal pain Exam today normal.  Recently had H. pylori infection about a year ago-not sure if this was adequately treated.  Will check labs today.  Consider trial of PPI versus GI referral depending on labs.   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  Fatigue Likely due to shift work sleep disorder.  Check CBC, C met, TSH, B12, testosterone, and vitamin D.  Elevated BP Has previously been well controlled.  Do not restart medication at this point.  Continue home monitoring with goal 140/90 or lower.  Preventative Healthcare: Tdap and flu vaccine today. Check CBC, CMET, TSH, and lipid panel.   Patient Counseling(The following topics were reviewed and/or handout was given):  -Nutrition: Stressed importance of moderation in sodium/caffeine intake, saturated fat and cholesterol, caloric balance, sufficient intake of fresh fruits, vegetables, and fiber.  -Stressed the importance of regular exercise.   -Substance Abuse: Discussed cessation/primary prevention of tobacco, alcohol, or other drug use; driving or other dangerous activities under the influence; availability of treatment for abuse.   -Injury prevention: Discussed safety belts, safety helmets, smoke detector, smoking near bedding or upholstery.   -Sexuality: Discussed sexually transmitted diseases, partner selection, use of condoms, avoidance of unintended pregnancy and contraceptive alternatives.   -Dental health: Discussed importance of regular tooth brushing, flossing, and dental visits.  -Health maintenance and immunizations reviewed. Please refer to Health maintenance section.  Return to care in 1 year for  next preventative visit.     Subjective:  HPI:  He has no acute complaints today.   Over the last year has been having an uncomfortable feeling in left upper quadrant. No nausea or vomiting. No constipation or diarrhea. No fevers or chills. Occurs randomly. Sometimes worse depending on diet. No other obvious precipitating events or aggravating factors. Was diagnosed with H pylori about a year ago. Was treated with a course of antibiotics but did not notice any significant change in his symptoms.   Patient also reports frequent URIs.  This is been going on for the last year to year and a half.  Has several days where he just feels fatigued, lethargic, and weak.  Usually associated with a sore throat.  Has not had any illnesses within the last few weeks.  Is also has some difficulty with going to sleep.  Currently works third shift.  He alternates every 2 to 3 days.  Has tried taking melatonin which helps.  Lifestyle Diet: Balanced. Plenty of fruits and vegetables.  Exercise: Not going to gym due to [place].   Depression screen PHQ 2/9 08/29/2019  Decreased Interest 1  Down, Depressed, Hopeless 1  PHQ - 2 Score 2  Altered sleeping 1  Tired, decreased energy 1  Change in appetite 1  Feeling bad or failure about yourself  0  Trouble concentrating 1  Moving slowly or fidgety/restless 1  Suicidal thoughts 0  PHQ-9 Score 7  Difficult doing work/chores Somewhat difficult    Health Maintenance Due  Topic Date Due  . TETANUS/TDAP  02/22/2005  . INFLUENZA VACCINE  06/29/2019     ROS: Per HPI, otherwise a complete review of systems was negative.   PMH:  The following were reviewed and entered/updated in  epic: Past Medical History:  Diagnosis Date  . H. pylori infection   . UTI (urinary tract infection)    Patient Active Problem List   Diagnosis Date Noted  . 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 Left upper quadrant abdominal pain 08/29/2019  . Recurrent URI (upper respiratory  infection) 08/29/2019  . H. pylori infection 06/15/2018   History reviewed. No pertinent surgical history.  Family History  Problem Relation Age of Onset  . Cancer Mother     Medications- reviewed and updated Current Outpatient Medications  Medication Sig Dispense Refill  . Multiple Vitamin (MULTIVITAMIN) capsule Take 1 capsule by mouth daily.     No current facility-administered medications for this visit.    Allergies-reviewed and updated No Known Allergies  Social History   Socioeconomic History  . Marital status: Single    Spouse name: Not on file  . Number of children: Not on file  . Years of education: Not on file  . Highest education level: Not on file  Occupational History  . Not on file  Social Needs  . Financial resource strain: Not on file  . Food insecurity    Worry: Not on file    Inability: Not on file  . Transportation needs    Medical: Not on file    Non-medical: Not on file  Tobacco Use  . Smoking status: Never Smoker  . Smokeless tobacco: Never Used  Substance and Sexual Activity  . Alcohol use: Never    Frequency: Never  . Drug use: Never  . Sexual activity: Not on file  Lifestyle  . Physical activity    Days per week: Not on file    Minutes per session: Not on file  . Stress: Not on file  Relationships  . Social Herbalist on phone: Not on file    Gets together: Not on file    Attends religious service: Not on file    Active member of club or organization: Not on file    Attends meetings of clubs or organizations: Not on file    Relationship status: Not on file  Other Topics Concern  . Not on file  Social History Narrative  . Not on file        Objective:  Physical Exam: BP (!) 146/89   Pulse 82   Temp 98.6 F (37 C)   Ht 6' 4"  (1.93 m)   Wt 198 lb 8 oz (90 kg)   SpO2 99%   BMI 24.16 kg/m   Body mass index is 24.16 kg/m. Wt Readings from Last 3 Encounters:  08/29/19 198 lb 8 oz (90 kg)  08/22/18 205 lb 12.8  oz (93.4 kg)  06/21/18 209 lb (94.8 kg)   Gen: NAD, resting comfortably HEENT: TMs normal bilaterally. OP clear. No thyromegaly noted.  CV: RRR with no murmurs appreciated Pulm: NWOB, CTAB with no crackles, wheezes, or rhonchi GI: Normal bowel sounds present. Soft, Nontender, Nondistended. MSK: no edema, cyanosis, or clubbing noted Skin: warm, dry Neuro: CN2-12 grossly intact. Strength 5/5 in upper and lower extremities. Reflexes symmetric and intact bilaterally.  Psych: Normal affect and thought content      M. [name], MD 08/29/2019 10:15 AM

## 2019-08-29 NOTE — Patient Instructions (Signed)
It was very nice to see you today!  We will check blood work today.  Depending on the results of your blood work, may need to start you on medication for stomach ulcer.  Please make sure that you are taking 1000 mg of vitamin C daily and 50 mg of zinc daily.  It is okay for you to take 10 mg of melatonin daily for your sleep.  We will give you your tetanus and flu vaccine today.   Come back in 1 year for your next physical, or sooner if needed.   Take care, Dr [name]  Please try these tips to maintain a healthy lifestyle:   Eat at least 3 REAL meals and 1-2 snacks per day.  Aim for no more than 5 hours between eating.  If you eat breakfast, please do so within one hour of getting up.    Obtain twice as many fruits/vegetables as protein or carbohydrate foods for both lunch and dinner. (Half of each meal should be fruits/vegetables, one quarter protein, and one quarter starchy carbs)   Cut down on sweet beverages. This includes juice, soda, and sweet tea.    Exercise at least 150 minutes every week.    Preventive Care 69-51 Years Old, Male Preventive care refers to lifestyle choices and visits with your health care provider that can promote health and wellness. This includes:  A yearly physical exam. This is also called an annual well check.  Regular dental and eye exams.  Immunizations.  Screening for certain conditions.  Healthy lifestyle choices, such as eating a healthy diet, getting regular exercise, not using drugs or products that contain nicotine and tobacco, and limiting alcohol use. What can I expect for my preventive care visit? Physical exam Your health care provider will check:  Height and weight. These may be used to calculate body mass index (BMI), which is a measurement that tells if you are at a healthy weight.  Heart rate and blood pressure.  Your skin for abnormal spots. Counseling Your health care provider may ask you questions about:   Alcohol, tobacco, and drug use.  Emotional well-being.  Home and relationship well-being.  Sexual activity.  Eating habits.  Work and work Statistician. What immunizations do I need?  Influenza (flu) vaccine  This is recommended every year. Tetanus, diphtheria, and pertussis (Tdap) vaccine  You may need a Td booster every 10 years. Varicella (chickenpox) vaccine  You may need this vaccine if you have not already been vaccinated. Human papillomavirus (HPV) vaccine  If recommended by your health care provider, you may need three doses over 6 months. Measles, mumps, and rubella (MMR) vaccine  You may need at least one dose of MMR. You may also need a second dose. Meningococcal conjugate (MenACWY) vaccine  One dose is recommended if you are 5-34 years old and a Market researcher living in a residence hall, or if you have one of several medical conditions. You may also need additional booster doses. Pneumococcal conjugate (PCV13) vaccine  You may need this if you have certain conditions and were not previously vaccinated. Pneumococcal polysaccharide (PPSV23) vaccine  You may need one or two doses if you smoke cigarettes or if you have certain conditions. Hepatitis A vaccine  You may need this if you have certain conditions or if you travel or work in places where you may be exposed to hepatitis A. Hepatitis B vaccine  You may need this if you have certain conditions or if you travel or  work in places where you may be exposed to hepatitis B. Haemophilus influenzae type b (Hib) vaccine  You may need this if you have certain risk factors. You may receive vaccines as individual doses or as more than one vaccine together in one shot (combination vaccines). Talk with your health care provider about the risks and benefits of combination vaccines. What tests do I need? Blood tests  Lipid and cholesterol levels. These may be checked every 5 years starting at age 39.   Hepatitis C test.  Hepatitis B test. Screening   Diabetes screening. This is done by checking your blood sugar (glucose) after you have not eaten for a while (fasting).  Sexually transmitted disease (STD) testing. Talk with your health care provider about your test results, treatment options, and if necessary, the need for more tests. Follow these instructions at home: Eating and drinking   Eat a diet that includes fresh fruits and vegetables, whole grains, lean protein, and low-fat dairy products.  Take vitamin and mineral supplements as recommended by your health care provider.  Do not drink alcohol if your health care provider tells you not to drink.  If you drink alcohol: ? Limit how much you have to 0-2 drinks a day. ? Be aware of how much alcohol is in your drink. In the [place] one drink equals one 12 oz bottle of beer (355 mL), one 5 oz glass of wine (148 mL), or one 1 oz glass of hard liquor (44 mL). Lifestyle  Take daily care of your teeth and gums.  Stay active. Exercise for at least 30 minutes on 5 or more days each week.  Do not use any products that contain nicotine or tobacco, such as cigarettes, e-cigarettes, and chewing tobacco. If you need help quitting, ask your health care provider.  If you are sexually active, practice safe sex. Use a condom or other form of protection to prevent STIs (sexually transmitted infections). What's next?  Go to your health care provider once a year for a well check visit.  Ask your health care provider how often you should have your eyes and teeth checked.  Stay up to date on all vaccines. This information is not intended to replace advice given to you by your health care provider. Make sure you discuss any questions you have with your health care provider. Document Released: 01/10/2002 Document Revised: 11/08/2018 Document Reviewed: 11/08/2018 Elsevier Patient Education  2020 [employer].

## 2019-08-29 NOTE — Assessment & Plan Note (Signed)
Exam today normal.  Recently had H. pylori infection about a year ago-not sure if this was adequately treated.  Will check labs today.  Consider trial of PPI versus GI referral depending on labs.

## 2019-08-30 ENCOUNTER — Other Ambulatory Visit: Payer: Self-pay

## 2019-08-30 DIAGNOSIS — R7989 Other specified abnormal findings of blood chemistry: Secondary | ICD-10-CM

## 2019-08-30 LAB — HIV ANTIBODY (ROUTINE TESTING W REFLEX): HIV 1&2 Ab, 4th Generation: NONREACTIVE

## 2019-08-30 MED ORDER — PANTOPRAZOLE SODIUM 40 MG PO TBEC: 40.0000 mg | DELAYED_RELEASE_TABLET | Freq: Every day | ORAL | 3 refills | Status: DC

## 2019-08-30 NOTE — Progress Notes (Signed)
Please inform patient of the following:  Thyroid levels are borderline - recommend coming back in a few weeks to recheck. Please place future order for TSH and free T4. This could explain some of his fatigue.  His Vitamin D level is low. Recommend starting supplementation with 1000 IU daily and we can recheck in 3-6 months. The rest of his blood work is normal. No clear cause for his abdominal pain. Recommend starting protonix 40mg  daily for 3-4 weeks to see if it helps with his symptoms. Please send in for patient.  Algis Greenhouse. [name], MD 08/30/2019 3:46 PM

## 2019-09-20 ENCOUNTER — Other Ambulatory Visit (INDEPENDENT_AMBULATORY_CARE_PROVIDER_SITE_OTHER): Payer: 59

## 2019-09-20 ENCOUNTER — Other Ambulatory Visit: Payer: Self-pay

## 2019-09-20 DIAGNOSIS — R7989 Other specified abnormal findings of blood chemistry: Secondary | ICD-10-CM

## 2019-09-20 LAB — T4, FREE: Free T4: 0.88 ng/dL (ref 0.60–1.60)

## 2019-09-20 LAB — TSH: TSH: 3.39 u[IU]/mL (ref 0.35–4.50)

## 2019-09-20 NOTE — Progress Notes (Signed)
Please inform patient of the following:  Thyroid levels are NORMAL. Would like for him to continue with the vitamin D and work on getting an adequate amount of sleep. Would like for him to let us know if still having issues with fatigue.  Manuel Smith. [name], MD 09/20/2019 4:21 PM

## 2019-10-02 ENCOUNTER — Other Ambulatory Visit: Payer: Self-pay

## 2019-10-02 ENCOUNTER — Encounter: Payer: Self-pay | Admitting: Family Medicine

## 2019-10-02 ENCOUNTER — Other Ambulatory Visit (INDEPENDENT_AMBULATORY_CARE_PROVIDER_SITE_OTHER): Payer: 59

## 2019-10-02 DIAGNOSIS — Z7251 High risk heterosexual behavior: Secondary | ICD-10-CM | POA: Diagnosis not present

## 2019-10-03 LAB — HIV ANTIBODY (ROUTINE TESTING W REFLEX): HIV 1&2 Ab, 4th Generation: NONREACTIVE

## 2019-10-03 NOTE — Progress Notes (Signed)
Dr Marigene Ehlers interpretation of your lab work:  Good news! Your HIV test is negative.    If you have any additional questions, please give [place] a call or send [place] a message through [place].  Take care, Dr [name]

## 2020-09-07 ENCOUNTER — Other Ambulatory Visit: Payer: Self-pay

## 2020-09-07 ENCOUNTER — Encounter: Payer: Self-pay | Admitting: Family Medicine

## 2020-09-07 ENCOUNTER — Ambulatory Visit (INDEPENDENT_AMBULATORY_CARE_PROVIDER_SITE_OTHER): Payer: No Typology Code available for payment source | Admitting: Family Medicine

## 2020-09-07 VITALS — BP 122/79 | HR 75 | Temp 98.6°F | Ht 76.0 in | Wt 197.8 lb

## 2020-09-07 DIAGNOSIS — Z1159 Encounter for screening for other viral diseases: Secondary | ICD-10-CM

## 2020-09-07 DIAGNOSIS — A048 Other specified bacterial intestinal infections: Secondary | ICD-10-CM | POA: Diagnosis not present

## 2020-09-07 DIAGNOSIS — Z0001 Encounter for general adult medical examination with abnormal findings: Secondary | ICD-10-CM | POA: Diagnosis not present

## 2020-09-07 DIAGNOSIS — Z13220 Encounter for screening for lipoid disorders: Secondary | ICD-10-CM | POA: Diagnosis not present

## 2020-09-07 DIAGNOSIS — E559 Vitamin D deficiency, unspecified: Secondary | ICD-10-CM | POA: Diagnosis not present

## 2020-09-07 DIAGNOSIS — Z23 Encounter for immunization: Secondary | ICD-10-CM

## 2020-09-07 DIAGNOSIS — Z114 Encounter for screening for human immunodeficiency virus [HIV]: Secondary | ICD-10-CM

## 2020-09-07 NOTE — Assessment & Plan Note (Signed)
Check vitamin D level.  Continue supplementation. 

## 2020-09-07 NOTE — Progress Notes (Signed)
Chief Complaint:  Manuel Smith is a 34 y.o. male who presents today for his annual comprehensive physical exam.    Assessment/Plan:  Chronic Problems Addressed Today: Vitamin D deficiency Check vitamin D level.  Continue supplementation.  H. pylori infection We will recheck breath test today.  If positive will treat again and then recheck.  If negative will need referral to GI for persistent left upper quadrant pain.  Preventative Healthcare: Flu vaccine given today.  Check CBC, CMET, TSH, lipid panel.  Check HIV and hepatitis C.  Patient Counseling(The following topics were reviewed and/or handout was given):  -Nutrition: Stressed importance of moderation in sodium/caffeine intake, saturated fat and cholesterol, caloric balance, sufficient intake of fresh fruits, vegetables, and fiber.  -Stressed the importance of regular exercise.   -Substance Abuse: Discussed cessation/primary prevention of tobacco, alcohol, or other drug use; driving or other dangerous activities under the influence; availability of treatment for abuse.   -Injury prevention: Discussed safety belts, safety helmets, smoke detector, smoking near bedding or upholstery.   -Sexuality: Discussed sexually transmitted diseases, partner selection, use of condoms, avoidance of unintended pregnancy and contraceptive alternatives.   -Dental health: Discussed importance of regular tooth brushing, flossing, and dental visits.  -Health maintenance and immunizations reviewed. Please refer to Health maintenance section.  Return to care in 1 year for next preventative visit.     Subjective:  HPI:  He has no acute complaints today.   Still has some recurrent left upper quadrant abdominal pain.  This has been going on for a couple of years at this point.  Was treated for H. pylori 2 years ago but had no improvement in symptoms.  Lifestyle Diet: Balanced.  Exercise: Goes to gym regularly.   Depression screen PHQ 2/9  08/29/2019  Decreased Interest 1  Down, Depressed, Hopeless 1  PHQ - 2 Score 2  Altered sleeping 1  Tired, decreased energy 1  Change in appetite 1  Feeling bad or failure about yourself  0  Trouble concentrating 1  Moving slowly or fidgety/restless 1  Suicidal thoughts 0  PHQ-9 Score 7  Difficult doing work/chores Somewhat difficult    Health Maintenance Due  Topic Date Due  . Hepatitis C Screening  Never done  . INFLUENZA VACCINE  06/28/2020     ROS: Per HPI, otherwise a complete review of systems was negative.   PMH:  The following were reviewed and entered/updated in epic: Past Medical History:  Diagnosis Date  . H. pylori infection   . UTI (urinary tract infection)    Patient Active Problem List   Diagnosis Date Noted  . Vitamin D deficiency 09/07/2020  . 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 H. pylori infection 06/15/2018   History reviewed. No pertinent surgical history.  Family History  Problem Relation Age of Onset  . Cancer Mother     Medications- reviewed and updated Current Outpatient Medications  Medication Sig Dispense Refill  . cholecalciferol (VITAMIN D3) 25 MCG (1000 UNIT) tablet Take 1,000 Units by mouth daily.     No current facility-administered medications for this visit.    Allergies-reviewed and updated No Known Allergies  Social History   Socioeconomic History  . Marital status: Single    Spouse name: Not on file  . Number of children: Not on file  . Years of education: Not on file  . Highest education level: Not on file  Occupational History  . Not on file  Tobacco Use  . Smoking status: Never Smoker  .  Smokeless tobacco: Never Used  Vaping Use  . Vaping Use: Never used  Substance and Sexual Activity  . Alcohol use: Never  . Drug use: Never  . Sexual activity: Not on file  Other Topics Concern  . Not on file  Social History Narrative  . Not on file   Social Determinants of Health   Financial Resource Strain:     . Difficulty of Paying Living Expenses: Not on file  Food Insecurity:   . Worried About Programme researcher, broadcasting/film/video in the Last Year: Not on file  . Ran Out of Food in the Last Year: Not on file  Transportation Needs:   . Lack of Transportation (Medical): Not on file  . Lack of Transportation (Non-Medical): Not on file  Physical Activity:   . Days of Exercise per Week: Not on file  . Minutes of Exercise per Session: Not on file  Stress:   . Feeling of Stress : Not on file  Social Connections:   . Frequency of Communication with Friends and Family: Not on file  . Frequency of Social Gatherings with Friends and Family: Not on file  . Attends Religious Services: Not on file  . Active Member of Clubs or Organizations: Not on file  . Attends Banker Meetings: Not on file  . Marital Status: Not on file        Objective:  Physical Exam: BP 122/79   Pulse 75   Temp 98.6 F (37 C) (Temporal)   Ht 6\' 4"  (1.93 m)   Wt 197 lb 12.8 oz (89.7 kg)   SpO2 98%   BMI 24.08 kg/m   Body mass index is 24.08 kg/m. Wt Readings from Last 3 Encounters:  09/07/20 197 lb 12.8 oz (89.7 kg)  08/29/19 198 lb 8 oz (90 kg)  08/22/18 205 lb 12.8 oz (93.4 kg)   Gen: NAD, resting comfortably HEENT: TMs normal bilaterally. OP clear. No thyromegaly noted.  CV: RRR with no murmurs appreciated Pulm: NWOB, CTAB with no crackles, wheezes, or rhonchi GI: Normal bowel sounds present. Soft, Nontender, Nondistended. MSK: no edema, cyanosis, or clubbing noted Skin: warm, dry Neuro: CN2-12 grossly intact. Strength 5/5 in upper and lower extremities. Reflexes symmetric and intact bilaterally.  Psych: Normal affect and thought content     Aspynn Clover M. 08/24/18, MD 09/07/2020 11:12 AM

## 2020-09-07 NOTE — Assessment & Plan Note (Signed)
We will recheck breath test today.  If positive will treat again and then recheck.  If negative will need referral to GI for persistent left upper quadrant pain.

## 2020-09-07 NOTE — Patient Instructions (Signed)
It was very nice to see you today!  We will check blood work and a breath test today.  Depending on the results of your breath test we may need to treat with another round of antibiotics or send you to see a GI specialist.  I will see you back in a year.  Please come back to see me sooner if needed.  Take care, Dr [name]  Please try these tips to maintain a healthy lifestyle:   Eat at least 3 REAL meals and 1-2 snacks per day.  Aim for no more than 5 hours between eating.  If you eat breakfast, please do so within one hour of getting up.    Each meal should contain half fruits/vegetables, one quarter protein, and one quarter carbs (no bigger than a computer mouse)   Cut down on sweet beverages. This includes juice, soda, and sweet tea.     Drink at least 1 glass of water with each meal and aim for at least 8 glasses per day   Exercise at least 150 minutes every week.    Preventive Care 26-72 Years Old, Male Preventive care refers to lifestyle choices and visits with your health care provider that can promote health and wellness. This includes:  A yearly physical exam. This is also called an annual well check.  Regular dental and eye exams.  Immunizations.  Screening for certain conditions.  Healthy lifestyle choices, such as eating a healthy diet, getting regular exercise, not using drugs or products that contain nicotine and tobacco, and limiting alcohol use. What can I expect for my preventive care visit? Physical exam Your health care provider will check:  Height and weight. These may be used to calculate body mass index (BMI), which is a measurement that tells if you are at a healthy weight.  Heart rate and blood pressure.  Your skin for abnormal spots. Counseling Your health care provider may ask you questions about:  Alcohol, tobacco, and drug use.  Emotional well-being.  Home and relationship well-being.  Sexual activity.  Eating habits.  Work and  work Statistician. What immunizations do I need?  Influenza (flu) vaccine  This is recommended every year. Tetanus, diphtheria, and pertussis (Tdap) vaccine  You may need a Td booster every 10 years. Varicella (chickenpox) vaccine  You may need this vaccine if you have not already been vaccinated. Human papillomavirus (HPV) vaccine  If recommended by your health care provider, you may need three doses over 6 months. Measles, mumps, and rubella (MMR) vaccine  You may need at least one dose of MMR. You may also need a second dose. Meningococcal conjugate (MenACWY) vaccine  One dose is recommended if you are 68-65 years old and a Market researcher living in a residence hall, or if you have one of several medical conditions. You may also need additional booster doses. Pneumococcal conjugate (PCV13) vaccine  You may need this if you have certain conditions and were not previously vaccinated. Pneumococcal polysaccharide (PPSV23) vaccine  You may need one or two doses if you smoke cigarettes or if you have certain conditions. Hepatitis A vaccine  You may need this if you have certain conditions or if you travel or work in places where you may be exposed to hepatitis A. Hepatitis B vaccine  You may need this if you have certain conditions or if you travel or work in places where you may be exposed to hepatitis B. Haemophilus influenzae type b (Hib) vaccine  You may need  this if you have certain risk factors. You may receive vaccines as individual doses or as more than one vaccine together in one shot (combination vaccines). Talk with your health care provider about the risks and benefits of combination vaccines. What tests do I need? Blood tests  Lipid and cholesterol levels. These may be checked every 5 years starting at age 78.  Hepatitis C test.  Hepatitis B test. Screening   Diabetes screening. This is done by checking your blood sugar (glucose) after you have not  eaten for a while (fasting).  Sexually transmitted disease (STD) testing. Talk with your health care provider about your test results, treatment options, and if necessary, the need for more tests. Follow these instructions at home: Eating and drinking   Eat a diet that includes fresh fruits and vegetables, whole grains, lean protein, and low-fat dairy products.  Take vitamin and mineral supplements as recommended by your health care provider.  Do not drink alcohol if your health care provider tells you not to drink.  If you drink alcohol: ? Limit how much you have to 0-2 drinks a day. ? Be aware of how much alcohol is in your drink. In the [place] one drink equals one 12 oz bottle of beer (355 mL), one 5 oz glass of wine (148 mL), or one 1 oz glass of hard liquor (44 mL). Lifestyle  Take daily care of your teeth and gums.  Stay active. Exercise for at least 30 minutes on 5 or more days each week.  Do not use any products that contain nicotine or tobacco, such as cigarettes, e-cigarettes, and chewing tobacco. If you need help quitting, ask your health care provider.  If you are sexually active, practice safe sex. Use a condom or other form of protection to prevent STIs (sexually transmitted infections). What's next?  Go to your health care provider once a year for a well check visit.  Ask your health care provider how often you should have your eyes and teeth checked.  Stay up to date on all vaccines. This information is not intended to replace advice given to you by your health care provider. Make sure you discuss any questions you have with your health care provider. Document Revised: 11/08/2018 Document Reviewed: 11/08/2018 Elsevier Patient Education  2020 [employer].

## 2020-09-08 LAB — COMPREHENSIVE METABOLIC PANEL
AG Ratio: 1.7 (calc) (ref 1.0–2.5)
ALT: 66 U/L — ABNORMAL HIGH (ref 9–46)
AST: 32 U/L (ref 10–40)
Albumin: 4.4 g/dL (ref 3.6–5.1)
Alkaline phosphatase (APISO): 71 U/L (ref 36–130)
BUN: 21 mg/dL (ref 7–25)
CO2: 31 mmol/L (ref 20–32)
Calcium: 9.4 mg/dL (ref 8.6–10.3)
Chloride: 103 mmol/L (ref 98–110)
Creat: 0.96 mg/dL (ref 0.60–1.35)
Globulin: 2.6 g/dL (calc) (ref 1.9–3.7)
Glucose, Bld: 105 mg/dL — ABNORMAL HIGH (ref 65–99)
Potassium: 4.3 mmol/L (ref 3.5–5.3)
Sodium: 140 mmol/L (ref 135–146)
Total Bilirubin: 1 mg/dL (ref 0.2–1.2)
Total Protein: 7 g/dL (ref 6.1–8.1)

## 2020-09-08 LAB — CBC
HCT: 43.3 % (ref 38.5–50.0)
Hemoglobin: 14.8 g/dL (ref 13.2–17.1)
MCH: 31.8 pg (ref 27.0–33.0)
MCHC: 34.2 g/dL (ref 32.0–36.0)
MCV: 92.9 fL (ref 80.0–100.0)
MPV: 9.5 fL (ref 7.5–12.5)
Platelets: 237 10*3/uL (ref 140–400)
RBC: 4.66 10*6/uL (ref 4.20–5.80)
RDW: 12 % (ref 11.0–15.0)
WBC: 4 10*3/uL (ref 3.8–10.8)

## 2020-09-08 LAB — LIPID PANEL
Cholesterol: 188 mg/dL (ref <200)
HDL: 64 mg/dL (ref >=40)
LDL Cholesterol (Calc): 109 mg/dL (calc) — ABNORMAL HIGH
Non-HDL Cholesterol (Calc): 124 mg/dL (calc) (ref <130)
Total CHOL/HDL Ratio: 2.9 (calc) (ref <5.0)
Triglycerides: 64 mg/dL (ref <150)

## 2020-09-08 LAB — HEPATITIS C ANTIBODY
Hepatitis C Ab: NONREACTIVE
SIGNAL TO CUT-OFF: 0.01 (ref <1.00)

## 2020-09-08 LAB — TESTOSTERONE: Testosterone: 536 ng/dL (ref 250–827)

## 2020-09-08 LAB — H. PYLORI BREATH TEST: H. pylori Breath Test: NOT DETECTED

## 2020-09-08 LAB — VITAMIN D 25 HYDROXY (VIT D DEFICIENCY, FRACTURES): Vit D, 25-Hydroxy: 28 ng/mL — ABNORMAL LOW (ref 30–100)

## 2020-09-08 LAB — TSH: TSH: 1.69 mIU/L (ref 0.40–4.50)

## 2020-09-08 LAB — HIV ANTIBODY (ROUTINE TESTING W REFLEX): HIV 1&2 Ab, 4th Generation: NONREACTIVE

## 2020-09-08 LAB — VITAMIN B12: Vitamin B-12: 376 pg/mL (ref 200–1100)

## 2020-09-09 NOTE — Progress Notes (Signed)
Please inform patient of the following:  Vitamin D slightly low - recommend he take 2000IU daily and we can recheck in 3-6 months.  Liver number was up slightly. Would like for him to come back in 1-2 weeks to recheck. Please place future order for CMET.   Cholesterol and blood sugar are borderline but everything else is stable.  He should continue working on diet and exercise and we can recheck in a year.  No clear expalantion for his abdominal pain. His h pylori test was negative. Recommend GI referral as we discussed at his office visit.

## 2020-09-16 ENCOUNTER — Other Ambulatory Visit: Payer: Self-pay | Admitting: *Deleted

## 2020-09-16 DIAGNOSIS — R748 Abnormal levels of other serum enzymes: Secondary | ICD-10-CM

## 2020-09-16 DIAGNOSIS — A048 Other specified bacterial intestinal infections: Secondary | ICD-10-CM

## 2020-09-17 ENCOUNTER — Other Ambulatory Visit: Payer: Self-pay

## 2020-09-17 ENCOUNTER — Other Ambulatory Visit: Payer: No Typology Code available for payment source

## 2020-09-17 DIAGNOSIS — R748 Abnormal levels of other serum enzymes: Secondary | ICD-10-CM

## 2020-09-17 LAB — COMPREHENSIVE METABOLIC PANEL
AG Ratio: 1.8 (calc) (ref 1.0–2.5)
ALT: 23 U/L (ref 9–46)
AST: 33 U/L (ref 10–40)
Albumin: 4.4 g/dL (ref 3.6–5.1)
Alkaline phosphatase (APISO): 62 U/L (ref 36–130)
BUN/Creatinine Ratio: 25 (calc) — ABNORMAL HIGH (ref 6–22)
BUN: 28 mg/dL — ABNORMAL HIGH (ref 7–25)
CO2: 30 mmol/L (ref 20–32)
Calcium: 9.4 mg/dL (ref 8.6–10.3)
Chloride: 105 mmol/L (ref 98–110)
Creat: 1.11 mg/dL (ref 0.60–1.35)
Globulin: 2.5 g/dL (calc) (ref 1.9–3.7)
Glucose, Bld: 101 mg/dL — ABNORMAL HIGH (ref 65–99)
Potassium: 4.1 mmol/L (ref 3.5–5.3)
Sodium: 141 mmol/L (ref 135–146)
Total Bilirubin: 0.5 mg/dL (ref 0.2–1.2)
Total Protein: 6.9 g/dL (ref 6.1–8.1)

## 2020-09-18 NOTE — Progress Notes (Signed)
Please inform patient of the following:  Liver numbers back to normal. We can recheck in a year.  Manuel Smith. [name], MD 09/18/2020 8:09 AM

## 2020-10-28 ENCOUNTER — Encounter: Payer: Self-pay | Admitting: Internal Medicine

## 2020-12-25 ENCOUNTER — Other Ambulatory Visit: Payer: Self-pay

## 2020-12-25 ENCOUNTER — Ambulatory Visit: Payer: BC Managed Care – PPO | Admitting: Internal Medicine

## 2020-12-25 ENCOUNTER — Encounter: Payer: Self-pay | Admitting: Internal Medicine

## 2020-12-25 VITALS — BP 124/78 | HR 92 | Ht 76.0 in | Wt 198.0 lb

## 2020-12-25 DIAGNOSIS — M940 Chondrocostal junction syndrome [Tietze]: Secondary | ICD-10-CM | POA: Insufficient documentation

## 2020-12-25 DIAGNOSIS — R1012 Left upper quadrant pain: Secondary | ICD-10-CM | POA: Diagnosis not present

## 2020-12-25 MED ORDER — DICLOFENAC SODIUM 1 % EX GEL: 4.0000 g | Freq: Four times a day (QID) | CUTANEOUS | 0 refills | Status: DC

## 2020-12-25 NOTE — Progress Notes (Signed)
Manuel Smith 35 y.o. 06-Feb-1986 [id number] Referred by: Ardith Dark, MD  Assessment & Plan:   Encounter Diagnoses  Name Primary?  . Slipping rib syndrome Yes  . LUQ pain      I believe this is a musculoskeletal problem most likely is slipping or floating rib syndrome. I tried to reassure him as best I could I do not think there is any real value in imaging at this point. I think he is making it worse by obsessively and compulsively touching the area which he did many times while here. I did try the maneuver to lift the ribs though I am not experience with this, it did not reproduce his pain but I am not sure I performed that properly.  I have recommended watching a YouTube video (see patient instructions) about slipping rib syndrome and how to treat it. Also to apply diclofenac gel 1% 4 g 3 times daily to 4 times a day.  If his symptoms fail to resolve would consider sports medicine or PT evaluation. Alternatively could do cross-sectional imaging but I do not think that is necessary at this time.  Follow-up me as needed  I appreciate the opportunity to care for this patient. CC: Ardith Dark, MD   Subjective:   Chief Complaint: Left upper quadrant pain HPI Patient is a 35 year old white man with a 2 year history of dull left upper quadrant pain that was intermittent and has become more constant. He can palpate an area in the medial left upper quadrant near his costal margin and says there is a dull pain there. It is there when he is not palpating but worse when he palpates. It is most bothersome when he bends over. Its not related to eating, defecation, breathing or other movements. He was found to have H. pylori by breath test in 2019 and was treated with PPI amoxicillin and clarithromycin. At that point he was complaining of midsternal chest pain, feeling like he could not breathe well, he was started on amlodipine for some high blood pressure. He was "very worried about  his heart". Because of his left upper quadrant symptoms that persisted he was tested in late 2021 and breath test showed resolution of H. Pylori.  He does not notice this when he lifts weights or does abdominal crunches. No Known Allergies Current Meds  Medication Sig  . cholecalciferol (VITAMIN D3) 25 MCG (1000 UNIT) tablet Take 1,000 Units by mouth daily.  . diclofenac Sodium (VOLTAREN) 1 % GEL Apply 4 g topically 4 (four) times daily.   Past Medical History:  Diagnosis Date  . H. pylori infection   . UTI (urinary tract infection)    History reviewed. No pertinent surgical history. Social History   Social History Narrative   Single, no children.   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   Review of Systems As per HPI. He has shiftwork sleeping disturbance. Some fatigue from that.  Objective:   Physical Exam BP 124/78   Pulse 92   Ht 6\' 4"  (1.93 m)   Wt 198 lb (89.8 kg)   BMI 24.10 kg/m  Tall well-developed well-nourished muscular but lean white man no acute distress Lungs are clear Heart sounds are normal S1-S2 no rubs murmurs or gallops  The ribs are nontender as is the chest wall otherwise including the sternum Abdomen there is a spot in the medial left upper quadrant next to the costal margin that he says reproduces his pain. He has a very long torso bowel sounds are present and there is no hepatosplenomegaly or mass. Carnett's sign is negative He is alert and oriented x3 Appropriate mood and affect   Data reviewed include primary care notes from 2019 2020 and 2021. Laboratory studies in the chart. Chest x-ray from 2019 including personal viewing of the images. Lab Results  Component Value Date   WBC 4.0 09/07/2020   HGB 14.8 09/07/2020    HCT 43.3 09/07/2020   MCV 92.9 09/07/2020   PLT 237 09/07/2020   He did have a transient elevation in ALT which resolved in a matter of weeks B12 level is normal TSH normal

## 2020-12-25 NOTE — Patient Instructions (Addendum)
I think you have slipping rib syndrome.  You can apply heat to the area and you must stop touching it so much - that is making it worse.  Watch this video. Follow the exercises.  [email or web address].org/  If you fail to get better we can send you to a sports medicine specialist.  We have sent the following medications to your pharmacy for you to pick up at your convenience: Over the counter diclofenac gel , use 3-4 times a day.  I appreciate the opportunity to care for you. Iva Boop, MD, [facility]

## 2021-02-23 ENCOUNTER — Ambulatory Visit (HOSPITAL_COMMUNITY)
Admission: EM | Admit: 2021-02-23 | Discharge: 2021-02-23 | Disposition: A | Discharge status: Home / Self Care | Payer: BC Managed Care – PPO | Attending: Emergency Medicine | Admitting: Emergency Medicine

## 2021-02-23 ENCOUNTER — Encounter (HOSPITAL_COMMUNITY): Payer: Self-pay

## 2021-02-23 ENCOUNTER — Other Ambulatory Visit: Payer: Self-pay

## 2021-02-23 DIAGNOSIS — Z113 Encounter for screening for infections with a predominantly sexual mode of transmission: Secondary | ICD-10-CM | POA: Diagnosis not present

## 2021-02-23 LAB — HIV ANTIBODY (ROUTINE TESTING W REFLEX): HIV Screen 4th Generation wRfx: NONREACTIVE

## 2021-02-23 NOTE — ED Triage Notes (Signed)
Pt presents for STD testing after partner was tested and treated for syphilis; pt states he is not having any symptoms.

## 2021-02-23 NOTE — Discharge Instructions (Signed)
Lab results pending 1-3 days, will be called for positive results  Do not have sex until labs results, if positive do not have sex until treatment complete

## 2021-02-23 NOTE — ED Provider Notes (Addendum)
[facility]    CSN: [id number] Arrival date & time: 02/23/21  1023      History   Chief Complaint Chief Complaint  Patient presents with  . Exposure to STD    HPI Manuel Smith is a 35 y.o. male.   Patient presents requesting sti screening after partner tested positive for syphilis. Denies discharge, itching, rash, lesions, urinary frequency, urgency, flank pain.   Past Medical History:  Diagnosis Date  . H. pylori infection   . UTI (urinary tract infection)     Patient Active Problem List   Diagnosis Date Noted  . 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 Vitamin D deficiency 09/07/2020  . 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 LUQ pain 08/29/2019  . H. pylori infection 06/15/2018    History reviewed. No pertinent surgical history.     Home Medications    Prior to Admission medications   Medication Sig Start Date End Date Taking? Authorizing Provider  cholecalciferol (VITAMIN D3) 25 MCG (1000 UNIT) tablet Take 1,000 Units by mouth daily.    [provider]  diclofenac Sodium (VOLTAREN) 1 % GEL Apply 4 g topically 4 (four) times daily. 12/25/20   Iva Boop, MD    Family History Family History  Problem Relation Age of Onset  . Uterine cancer Mother   . Liver cancer Maternal Grandfather   . Stomach cancer Neg Hx   . Colon cancer Neg Hx   . Pancreatic cancer Neg Hx     Social History Social History   Tobacco Use  . Smoking status: Never Smoker  . Smokeless tobacco: Never Used  Vaping Use  . Vaping Use: Never used  Substance Use Topics  . Alcohol use: Never  . Drug use: Never     Allergies   Patient has no known allergies.   Review of Systems Review of Systems  Constitutional: Negative.   Respiratory: Negative.   Cardiovascular: Negative.   Genitourinary: Negative.   Skin: Negative.      Physical Exam Triage Vital Signs ED Triage Vitals  Enc Vitals Group     BP 02/23/21 1055 136/75     Pulse Rate  02/23/21 1055 91     Resp 02/23/21 1055 18     Temp 02/23/21 1055 98.7 F (37.1 C)     Temp Source 02/23/21 1055 Oral     SpO2 02/23/21 1055 100 %     Weight --      Height --      Head Circumference --      Peak Flow --      Pain Score 02/23/21 1054 0     Pain Loc --      Pain Edu? --      Excl. in GC? --    No data found.  Updated Vital Signs BP 136/75 (BP Location: Right Arm)   Pulse 91   Temp 98.7 F (37.1 C) (Oral)   Resp 18   SpO2 100%   Visual Acuity Right Eye Distance:   Left Eye Distance:   Bilateral Distance:    Right Eye Near:   Left Eye Near:    Bilateral Near:     Physical Exam Constitutional:      Appearance: Normal appearance. He is normal weight.  HENT:     Head: Normocephalic.  Eyes:     Extraocular Movements: Extraocular movements intact.  Pulmonary:     Effort: Pulmonary effort is normal.  Musculoskeletal:  General: Normal range of motion.     Cervical back: Normal range of motion.  Skin:    General: Skin is warm and dry.  Neurological:     Mental Status: He is alert and oriented to person, place, and time. Mental status is at baseline.  Psychiatric:        Mood and Affect: Mood normal.        Behavior: Behavior normal.        Thought Content: Thought content normal.        Judgment: Judgment normal.      UC Treatments / Results  Labs (all labs ordered are listed, but only abnormal results are displayed) Labs Reviewed  RPR  HIV ANTIBODY (ROUTINE TESTING W REFLEX)  CYTOLOGY, (ORAL, ANAL, URETHRAL) ANCILLARY ONLY    EKG   Radiology No results found.  Procedures Procedures (including critical care time)  Medications Ordered in UC Medications - No data to display  Initial Impression / Assessment and Plan / UC Course  I have reviewed the triage vital signs and the nursing notes.  Pertinent labs & imaging results that were available during my care of the patient were reviewed by me and considered in my medical  decision making (see chart for details).  Routine screening for STI  1. Labs pending 1-3 days, will treat per protocol 2. Advised abstinence until labs result and/or treatment complete  Final Clinical Impressions(s) / UC Diagnoses   Final diagnoses:  Routine screening for STI (sexually transmitted infection)     Discharge Instructions     Lab results pending 1-3 days, will be called for positive results  Do not have sex until labs results, if positive do not have sex until treatment complete    ED Prescriptions    None     PDMP not reviewed this encounter.   Valinda Hoar, NP 02/23/21 1118    Salli Quarry R, [place] 02/23/21 1135

## 2021-02-24 LAB — CYTOLOGY, (ORAL, ANAL, URETHRAL) ANCILLARY ONLY
Chlamydia: NEGATIVE
Comment: NEGATIVE
Comment: NEGATIVE
Comment: NORMAL
Neisseria Gonorrhea: NEGATIVE
Trichomonas: NEGATIVE

## 2021-02-24 LAB — SYPHILIS: RPR W/REFLEX TO RPR TITER AND TREPONEMAL ANTIBODIES, TRADITIONAL SCREENING AND DIAGNOSIS ALGORITHM: RPR Ser Ql: NONREACTIVE

## 2021-04-21 ENCOUNTER — Encounter (HOSPITAL_COMMUNITY): Payer: Self-pay | Admitting: Emergency Medicine

## 2021-04-21 ENCOUNTER — Other Ambulatory Visit: Payer: Self-pay

## 2021-04-21 ENCOUNTER — Ambulatory Visit (HOSPITAL_COMMUNITY)
Admission: EM | Admit: 2021-04-21 | Discharge: 2021-04-21 | Disposition: A | Discharge status: Home / Self Care | Payer: BC Managed Care – PPO | Attending: Medical Oncology | Admitting: Medical Oncology

## 2021-04-21 DIAGNOSIS — R591 Generalized enlarged lymph nodes: Secondary | ICD-10-CM | POA: Diagnosis not present

## 2021-04-21 DIAGNOSIS — L290 Pruritus ani: Secondary | ICD-10-CM | POA: Diagnosis not present

## 2021-04-21 DIAGNOSIS — Z202 Contact with and (suspected) exposure to infections with a predominantly sexual mode of transmission: Secondary | ICD-10-CM | POA: Diagnosis not present

## 2021-04-21 LAB — HIV ANTIBODY (ROUTINE TESTING W REFLEX): HIV Screen 4th Generation wRfx: NONREACTIVE

## 2021-04-21 MED ORDER — NYSTATIN 100000 UNIT/GM EX POWD: 1.0000 "application " | Freq: Three times a day (TID) | CUTANEOUS | 0 refills | Status: DC

## 2021-04-21 NOTE — ED Provider Notes (Addendum)
[facility]    CSN: [id number] Arrival date & time: 04/21/21  1615      History   Chief Complaint Chief Complaint  Patient presents with  . Exposure to STD    HPI Manuel Smith is a 35 y.o. male.   HPI  STI screening: Patient reports that he has noticed a few nodules in his groin area over the past 2 days and would like retesting for STIs at this time due to these enlargements and history of Syphilis exposure a few months ago. He continues to have regular testing following this expsure.  He denies any testicular discharge, groin pain, testicular pain, fevers, flulike symptoms. He has had some rectal itching without history of rectal trauma.    Past Medical History:  Diagnosis Date  . H. pylori infection   . UTI (urinary tract infection)     Patient Active Problem List   Diagnosis Date Noted  . 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 Vitamin D deficiency 09/07/2020  . 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 LUQ pain 08/29/2019  . H. pylori infection 06/15/2018    History reviewed. No pertinent surgical history.     Home Medications    Prior to Admission medications   Medication Sig Start Date End Date Taking? Authorizing Provider  cholecalciferol (VITAMIN D3) 25 MCG (1000 UNIT) tablet Take 1,000 Units by mouth daily.    [provider]  diclofenac Sodium (VOLTAREN) 1 % GEL Apply 4 g topically 4 (four) times daily. 12/25/20   Iva Boop, MD    Family History Family History  Problem Relation Age of Onset  . Uterine cancer Mother   . Liver cancer Maternal Grandfather   . Stomach cancer Neg Hx   . Colon cancer Neg Hx   . Pancreatic cancer Neg Hx     Social History Social History   Tobacco Use  . Smoking status: Never Smoker  . Smokeless tobacco: Never Used  Vaping Use  . Vaping Use: Never used  Substance Use Topics  . Alcohol use: Never  . Drug use: Never     Allergies   Patient has no known allergies.   Review  of Systems Review of Systems  As stated above in HPI Physical Exam Triage Vital Signs ED Triage Vitals [04/21/21 1702]  Enc Vitals Group     BP 130/69     Pulse Rate 78     Resp 18     Temp 98.4 F (36.9 C)     Temp Source Oral     SpO2 100 %     Weight      Height      Head Circumference      Peak Flow      Pain Score      Pain Loc      Pain Edu?      Excl. in GC?    No data found.  Updated Vital Signs BP 130/69 (BP Location: Right Arm)   Pulse 78   Temp 98.4 F (36.9 C) (Oral)   Resp 18   SpO2 100%   Physical Exam Vitals and nursing note reviewed. Exam conducted with a chaperone present Waldo Laine).  Constitutional:      General: He is not in acute distress.    Appearance: Normal appearance. He is not ill-appearing, toxic-appearing or diaphoretic.  Cardiovascular:     Rate and Rhythm: Normal rate and regular rhythm.     Heart sounds: Normal heart sounds.  Pulmonary:  Effort: Pulmonary effort is normal.     Breath sounds: Normal breath sounds.  Chest:  Breasts:     Right: No axillary adenopathy or supraclavicular adenopathy.     Left: No axillary adenopathy.    Lymphadenopathy:     Head:     Right side of head: No submental, submandibular, tonsillar, preauricular, posterior auricular or occipital adenopathy.     Left side of head: No submental, submandibular, tonsillar, preauricular, posterior auricular or occipital adenopathy.     Cervical: No cervical adenopathy.     Right cervical: No superficial, deep or posterior cervical adenopathy.    Left cervical: No superficial, deep or posterior cervical adenopathy.     Upper Body:     Right upper body: No supraclavicular, axillary, pectoral or epitrochlear adenopathy.     Left upper body: No axillary, pectoral or epitrochlear adenopathy.     Lower Body: Right inguinal adenopathy present. Left inguinal adenopathy present.  Skin:    Findings: No lesion.     Comments: Well demarcated slightly erythematic rash  of the rectum.   Neurological:     Mental Status: He is alert.      UC Treatments / Results  Labs (all labs ordered are listed, but only abnormal results are displayed) Labs Reviewed - No data to display  EKG   Radiology No results found.  Procedures Procedures (including critical care time)  Medications Ordered in UC Medications - No data to display  Initial Impression / Assessment and Plan / UC Course  I have reviewed the triage vital signs and the nursing notes.  Pertinent labs & imaging results that were available during my care of the patient were reviewed by me and considered in my medical decision making (see chart for details).     New. Screening for STI. Discussed red flag sign and symptoms. For now also treating with nystatin powder for his likely fungal rectal rash. Continue safe sex practices. Follow up PRN.  Final Clinical Impressions(s) / UC Diagnoses   Final diagnoses:  None   Discharge Instructions   None    ED Prescriptions    None     PDMP not reviewed this encounter.   Rushie Chestnut, PA-C 04/21/21 1747    Rushie Chestnut, PA-C 04/21/21 [phone]

## 2021-04-21 NOTE — ED Triage Notes (Signed)
Pt is present today bilaterial groin nodules. Pt states that the nodules does feel uncomfortable. Pt states that he noticed the nodules last week. Pt would also like to be retested for STD.

## 2021-04-22 ENCOUNTER — Ambulatory Visit (HOSPITAL_COMMUNITY)
Admission: EM | Admit: 2021-04-22 | Discharge: 2021-04-22 | Disposition: A | Discharge status: Home / Self Care | Payer: BC Managed Care – PPO | Attending: Emergency Medicine | Admitting: Emergency Medicine

## 2021-04-22 ENCOUNTER — Other Ambulatory Visit: Payer: Self-pay

## 2021-04-22 LAB — CYTOLOGY, (ORAL, ANAL, URETHRAL) ANCILLARY ONLY
Chlamydia: NEGATIVE
Comment: NEGATIVE
Comment: NEGATIVE
Comment: NORMAL
Neisseria Gonorrhea: NEGATIVE
Trichomonas: NEGATIVE

## 2021-04-22 LAB — RPR
RPR Ser Ql: REACTIVE — AB
RPR Titer: 1:64 {titer}

## 2021-04-22 MED ORDER — PENICILLIN G BENZATHINE 1200000 UNIT/2ML IM SUSY: 2.4000 10*6.[IU] | PREFILLED_SYRINGE | Freq: Once | INTRAMUSCULAR | Status: DC

## 2021-04-22 MED ORDER — PENICILLIN G BENZATHINE 1200000 UNIT/2ML IM SUSY
PREFILLED_SYRINGE | INTRAMUSCULAR | Status: AC
  Filled 2021-04-22: qty 4

## 2021-04-22 NOTE — ED Triage Notes (Signed)
Pt wanted to be treated for RPR but we are still awaiting confirmation... adv him we will call him to come and get treated once the rest of the labs come back.   Pt verb understanding.

## 2021-04-24 ENCOUNTER — Other Ambulatory Visit: Payer: Self-pay

## 2021-04-24 ENCOUNTER — Ambulatory Visit (HOSPITAL_COMMUNITY)
Admission: EM | Admit: 2021-04-24 | Discharge: 2021-04-24 | Disposition: A | Discharge status: Home / Self Care | Payer: BC Managed Care – PPO | Attending: Emergency Medicine | Admitting: Emergency Medicine

## 2021-04-24 ENCOUNTER — Encounter (HOSPITAL_COMMUNITY): Payer: Self-pay

## 2021-04-24 DIAGNOSIS — A539 Syphilis, unspecified: Secondary | ICD-10-CM | POA: Diagnosis not present

## 2021-04-24 MED ORDER — PENICILLIN G BENZATHINE 1200000 UNIT/2ML IM SUSY
2.4000 10*6.[IU] | PREFILLED_SYRINGE | Freq: Once | INTRAMUSCULAR | Status: AC
  Administered 2021-04-24: 2.4 10*6.[IU] via INTRAMUSCULAR

## 2021-04-24 MED ORDER — PENICILLIN G BENZATHINE 1200000 UNIT/2ML IM SUSY
PREFILLED_SYRINGE | INTRAMUSCULAR | Status: AC
  Filled 2021-04-24: qty 4

## 2021-04-24 NOTE — ED Provider Notes (Signed)
[facility]    CSN: [id number] Arrival date & time: 04/24/21  1402      History   Chief Complaint Chief Complaint  Patient presents with  . std treatment    HPI Manuel Smith is a 35 y.o. male.   Patient here for re-evaluation and treatment for STI.  Reports being exposed to syphilis several months ago and tested negative in March.  Reports being re-tested this week and test came back positive, awaiting confirmation.  Reports lymph nodes in groin have gotten more swollen since last evaluation.  Denies any sores or penile pain.  Denies any discharge, dysuria, urgency or frequency.  Denies any trauma, injury, or other precipitating event.  Denies any specific alleviating or aggravating factors.  Denies any fevers, chest pain, shortness of breath, N/V/D, numbness, tingling, weakness, abdominal pain, or headaches.    The history is provided by the patient.    Past Medical History:  Diagnosis Date  . H. pylori infection   . UTI (urinary tract infection)     Patient Active Problem List   Diagnosis Date Noted  . 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 Vitamin D deficiency 09/07/2020  . 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 LUQ pain 08/29/2019  . H. pylori infection 06/15/2018    History reviewed. No pertinent surgical history.     Home Medications    Prior to Admission medications   Medication Sig Start Date End Date Taking? Authorizing Provider  cholecalciferol (VITAMIN D3) 25 MCG (1000 UNIT) tablet Take 1,000 Units by mouth daily.    [provider]  diclofenac Sodium (VOLTAREN) 1 % GEL Apply 4 g topically 4 (four) times daily. 12/25/20   Iva Boop, MD  nystatin (MYCOSTATIN/NYSTOP) powder Apply 1 application topically 3 (three) times daily. 04/21/21   Rushie Chestnut, PA-C    Family History Family History  Problem Relation Age of Onset  . Uterine cancer Mother   . Liver cancer Maternal Grandfather   . Stomach cancer Neg Hx   .  Colon cancer Neg Hx   . Pancreatic cancer Neg Hx     Social History Social History   Tobacco Use  . Smoking status: Never Smoker  . Smokeless tobacco: Never Used  Vaping Use  . Vaping Use: Never used  Substance Use Topics  . Alcohol use: Never  . Drug use: Never     Allergies   Patient has no known allergies.   Review of Systems Review of Systems  Hematological: Positive for adenopathy.  All other systems reviewed and are negative.  As stated above in HPI Physical Exam Triage Vital Signs ED Triage Vitals [04/21/21 1702]  Enc Vitals Group     BP 130/69     Pulse Rate 78     Resp 18     Temp 98.4 F (36.9 C)     Temp Source Oral     SpO2 100 %     Weight      Height      Head Circumference      Peak Flow      Pain Score      Pain Loc      Pain Edu?      Excl. in GC?    No data found.  Updated Vital Signs BP 139/85 (BP Location: Right Arm)   Pulse 73   Temp 98.9 F (37.2 C) (Oral)   Resp 19   SpO2 97%   Physical Exam Vitals and nursing  note reviewed.  Constitutional:      General: He is not in acute distress.    Appearance: Normal appearance. He is not ill-appearing, toxic-appearing or diaphoretic.  HENT:     Head: Normocephalic and atraumatic.  Eyes:     Pupils: Pupils are equal, round, and reactive to light.  Cardiovascular:     Rate and Rhythm: Normal rate and regular rhythm.     Heart sounds: Normal heart sounds.  Pulmonary:     Effort: Pulmonary effort is normal.     Breath sounds: Normal breath sounds.  Abdominal:     General: Abdomen is flat.  Musculoskeletal:        General: Normal range of motion.     Cervical back: Normal range of motion and neck supple.  Lymphadenopathy:     Cervical: No cervical adenopathy.     Lower Body: Right inguinal adenopathy present. Left inguinal adenopathy present.  Skin:    General: Skin is warm and dry.  Neurological:     General: No focal deficit present.     Mental Status: He is alert.   Psychiatric:        Mood and Affect: Mood normal.      UC Treatments / Results  Labs (all labs ordered are listed, but only abnormal results are displayed) Labs Reviewed - No data to display  EKG   Radiology No results found.  Procedures Procedures  (including critical care time)  Medications Ordered in UC Medications  penicillin g benzathine (BICILLIN LA) 1200000 UNIT/2ML injection 2.4 Million Units (2.4 Million Units Intramuscular Given 04/24/21 1543)    Initial Impression / Assessment and Plan / UC Course  I have reviewed the triage vital signs and the nursing notes.  Pertinent labs & imaging results that were available during my care of the patient were reviewed by me and considered in my medical decision making (see chart for details).    Assessment negative for red flags or concerns.  Syphilis confirmation is pending but since he has adenopathy and a known exposure, we will treat with Bicillin 2.4 million units IM x1.  Patient may be retested in 3 months to confirm treatment was successful.  Discussed safe sex practices including condom or other barrier method use.  Follow up with primary care as needed.  Final Clinical Impressions(s) / UC Diagnoses   Final diagnoses:  Syphilis     Discharge Instructions     We will contact you with the results from your lab work and any additional treatment.    You received an injection of Penicillin G today.  You can come back to be re-tested in 3 months (90 days).    Do not have sex while taking undergoing treatment for STI.  Make sure that all of your partners get tested and treated.   Use a condom or other barrier method for all sexual encounters.    Return or go to the Emergency Department if symptoms worsen or do not improve in the next few days.      ED Prescriptions    None     PDMP not reviewed this encounter.     Ivette Loyal, NP 04/24/21 [phone]

## 2021-04-24 NOTE — ED Triage Notes (Signed)
Pt in for std treatment for syphilis,   pt states his glands in his groin are swollen and tender which is new since he was seen on wednesday

## 2021-04-24 NOTE — Discharge Instructions (Signed)
We will contact you with the results from your lab work and any additional treatment.    You received an injection of Penicillin G today.  You can come back to be re-tested in 3 months (90 days).    Do not have sex while taking undergoing treatment for STI.  Make sure that all of your partners get tested and treated.   Use a condom or other barrier method for all sexual encounters.    Return or go to the Emergency Department if symptoms worsen or do not improve in the next few days.

## 2021-04-28 LAB — T.PALLIDUM AB, TOTAL: T Pallidum Abs: REACTIVE — AB

## 2021-04-29 ENCOUNTER — Ambulatory Visit (HOSPITAL_COMMUNITY)
Admission: EM | Admit: 2021-04-29 | Discharge: 2021-04-29 | Disposition: A | Discharge status: Home / Self Care | Payer: BC Managed Care – PPO | Attending: Emergency Medicine | Admitting: Emergency Medicine

## 2021-04-29 ENCOUNTER — Other Ambulatory Visit: Payer: Self-pay

## 2021-04-29 ENCOUNTER — Encounter (HOSPITAL_COMMUNITY): Payer: Self-pay

## 2021-04-29 DIAGNOSIS — B349 Viral infection, unspecified: Secondary | ICD-10-CM | POA: Diagnosis not present

## 2021-04-29 DIAGNOSIS — K6289 Other specified diseases of anus and rectum: Secondary | ICD-10-CM | POA: Insufficient documentation

## 2021-04-29 LAB — POCT RAPID STREP A, ED / UC: Streptococcus, Group A Screen (Direct): NEGATIVE

## 2021-04-29 NOTE — ED Triage Notes (Signed)
Pt presents with sore throat, chills, weakness and generalized body aches X 3 days; pt states he was recently treated for syphilis on Saturday.

## 2021-04-29 NOTE — Discharge Instructions (Signed)
You can take Tylenol and/or Ibuprofen as needed for fever reduction and pain relief.   Use preparation H for anal discomfort.  I would also recommend Colace or another stool soften to avoid hard bowel movements which can be further irritation.    For cough: honey 1/2 to 1 teaspoon (you can dilute the honey in water or another fluid).  You can also use guaifenesin and dextromethorphan for cough. You can use a humidifier for chest congestion and cough.  If you don't have a humidifier, you can sit in the bathroom with the hot shower running.     For sore throat: try warm salt water gargles, cepacol lozenges, throat spray, warm tea or water with lemon/honey, popsicles or ice, or OTC cold relief medicine for throat discomfort.    For congestion: take a daily anti-histamine like Zyrtec, Claritin, and a oral decongestant, such as pseudoephedrine.  You can also use Flonase 1-2 sprays in each nostril daily.    It is important to stay hydrated: drink plenty of fluids (water, gatorade/powerade/pedialyte, juices, or teas) to keep your throat moisturized and help further relieve irritation/discomfort.   Return or go to the Emergency Department if symptoms worsen or do not improve in the next few days.

## 2021-04-29 NOTE — ED Provider Notes (Signed)
[facility]    CSN: [id number] Arrival date & time: 04/29/21  2778      History   Chief Complaint Chief Complaint  Patient presents with  . Sore Throat  . Chills  . Weakness    HPI Manuel Smith is a 35 y.o. male.   Patient here for chills, sore throat, and fatigue that has been ongoing for the past few days.  Patient was evaluated on Saturday and treated for syphilis.  Reports lymph nodes in groin are no longer painful but are still swollen.  Reports now having sore throat and chills.  Reports taking DayQuil/NyQuil with minimal relief.  Reports having similar symptoms with strep throat in the past.  Reports painful to swallow but managing secretions. Denies any trauma, injury, or other precipitating event. Denies any fevers, chest pain, shortness of breath, N/V/D, numbness, tingling, weakness, abdominal pain, or headaches.    The history is provided by the patient.    Past Medical History:  Diagnosis Date  . H. pylori infection   . UTI (urinary tract infection)     Patient Active Problem List   Diagnosis Date Noted  . 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 Vitamin D deficiency 09/07/2020  . 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 LUQ pain 08/29/2019  . H. pylori infection 06/15/2018    History reviewed. No pertinent surgical history.     Home Medications    Prior to Admission medications   Medication Sig Start Date End Date Taking? Authorizing Provider  cholecalciferol (VITAMIN D3) 25 MCG (1000 UNIT) tablet Take 1,000 Units by mouth daily.    [provider]  diclofenac Sodium (VOLTAREN) 1 % GEL Apply 4 g topically 4 (four) times daily. 12/25/20   Iva Boop, MD  nystatin (MYCOSTATIN/NYSTOP) powder Apply 1 application topically 3 (three) times daily. 04/21/21   Rushie Chestnut, PA-C    Family History Family History  Problem Relation Age of Onset  . Uterine cancer Mother   . Liver cancer Maternal Grandfather   . Stomach  cancer Neg Hx   . Colon cancer Neg Hx   . Pancreatic cancer Neg Hx     Social History Social History   Tobacco Use  . Smoking status: Never Smoker  . Smokeless tobacco: Never Used  Vaping Use  . Vaping Use: Never used  Substance Use Topics  . Alcohol use: Never  . Drug use: Never     Allergies   Patient has no known allergies.   Review of Systems Review of Systems  Constitutional: Positive for chills, fatigue and fever.  HENT: Positive for sore throat.   All other systems reviewed and are negative.    Physical Exam Triage Vital Signs ED Triage Vitals  Enc Vitals Group     BP 04/29/21 1016 133/70     Pulse Rate 04/29/21 1016 (!) 107     Resp 04/29/21 1016 18     Temp 04/29/21 1016 98.5 F (36.9 C)     Temp Source 04/29/21 1016 Oral     SpO2 04/29/21 1016 100 %     Weight --      Height --      Head Circumference --      Peak Flow --      Pain Score 04/29/21 1015 5     Pain Loc --      Pain Edu? --      Excl. in GC? --    No data found.  Updated  Vital Signs BP 133/70 (BP Location: Right Arm)   Pulse (!) 107   Temp 98.5 F (36.9 C) (Oral)   Resp 18   SpO2 100%   Visual Acuity Right Eye Distance:   Left Eye Distance:   Bilateral Distance:    Right Eye Near:   Left Eye Near:    Bilateral Near:     Physical Exam Vitals and nursing note reviewed.  Constitutional:      General: He is not in acute distress.    Appearance: Normal appearance. He is not ill-appearing, toxic-appearing or diaphoretic.  HENT:     Head: Normocephalic and atraumatic.     Nose: No congestion or rhinorrhea.     Mouth/Throat:     Pharynx: Uvula midline. Pharyngeal swelling, oropharyngeal exudate and posterior oropharyngeal erythema present. No uvula swelling.     Tonsils: Tonsillar exudate present. No tonsillar abscesses. 2+ on the right. 2+ on the left.  Eyes:     Conjunctiva/sclera: Conjunctivae normal.  Cardiovascular:     Rate and Rhythm: Normal rate.     Pulses:  Normal pulses.  Pulmonary:     Effort: Pulmonary effort is normal.  Abdominal:     General: Abdomen is flat.  Musculoskeletal:        General: Normal range of motion.     Cervical back: Normal range of motion.  Skin:    General: Skin is warm and dry.  Neurological:     General: No focal deficit present.     Mental Status: He is alert and oriented to person, place, and time.  Psychiatric:        Mood and Affect: Mood normal.      UC Treatments / Results  Labs (all labs ordered are listed, but only abnormal results are displayed) Labs Reviewed  CULTURE, GROUP A STREP [facility])  POCT RAPID STREP A, ED / UC    EKG   Radiology No results found.  Procedures Procedures (including critical care time)  Medications Ordered in UC Medications - No data to display  Initial Impression / Assessment and Plan / UC Course  I have reviewed the triage vital signs and the nursing notes.  Pertinent labs & imaging results that were available during my care of the patient were reviewed by me and considered in my medical decision making (see chart for details).    Assessment negative for red flags or concerns.  Rapid strep negative in office, throat culture pending.  Likely viral illness.  Discussed conservative symptom management as described in discharge instructions.  Encouraged fluids and rest.  Recommend preparation H and a stool softener for anal discomfort.  Follow up with primary care as needed.   Final Clinical Impressions(s) / UC Diagnoses   Final diagnoses:  Viral illness  Anal burning     Discharge Instructions     You can take Tylenol and/or Ibuprofen as needed for fever reduction and pain relief.   Use preparation H for anal discomfort.  I would also recommend Colace or another stool soften to avoid hard bowel movements which can be further irritation.    For cough: honey 1/2 to 1 teaspoon (you can dilute the honey in water or another fluid).  You can also use  guaifenesin and dextromethorphan for cough. You can use a humidifier for chest congestion and cough.  If you don't have a humidifier, you can sit in the bathroom with the hot shower running.     For sore throat: try warm salt  water gargles, cepacol lozenges, throat spray, warm tea or water with lemon/honey, popsicles or ice, or OTC cold relief medicine for throat discomfort.    For congestion: take a daily anti-histamine like Zyrtec, Claritin, and a oral decongestant, such as pseudoephedrine.  You can also use Flonase 1-2 sprays in each nostril daily.    It is important to stay hydrated: drink plenty of fluids (water, gatorade/powerade/pedialyte, juices, or teas) to keep your throat moisturized and help further relieve irritation/discomfort.   Return or go to the Emergency Department if symptoms worsen or do not improve in the next few days.      ED Prescriptions    None     PDMP not reviewed this encounter.   Ivette Loyal, NP 04/29/21 1112

## 2021-05-02 LAB — CULTURE, GROUP A STREP (THRC)

## 2021-08-10 ENCOUNTER — Ambulatory Visit (INDEPENDENT_AMBULATORY_CARE_PROVIDER_SITE_OTHER): Payer: BC Managed Care – PPO | Admitting: Family Medicine

## 2021-08-10 ENCOUNTER — Other Ambulatory Visit: Payer: Self-pay

## 2021-08-10 ENCOUNTER — Other Ambulatory Visit (HOSPITAL_COMMUNITY)
Admission: RE | Admit: 2021-08-10 | Discharge: 2021-08-10 | Disposition: A | Discharge status: Home / Self Care | Payer: BC Managed Care – PPO | Source: Ambulatory Visit | Attending: Family Medicine | Admitting: Family Medicine

## 2021-08-10 ENCOUNTER — Encounter: Payer: Self-pay | Admitting: Family Medicine

## 2021-08-10 VITALS — BP 135/79 | HR 78 | Temp 97.3°F | Ht 76.0 in | Wt 202.4 lb

## 2021-08-10 DIAGNOSIS — Z0001 Encounter for general adult medical examination with abnormal findings: Secondary | ICD-10-CM | POA: Diagnosis not present

## 2021-08-10 DIAGNOSIS — E559 Vitamin D deficiency, unspecified: Secondary | ICD-10-CM

## 2021-08-10 DIAGNOSIS — Z13220 Encounter for screening for lipoid disorders: Secondary | ICD-10-CM

## 2021-08-10 DIAGNOSIS — Z23 Encounter for immunization: Secondary | ICD-10-CM

## 2021-08-10 DIAGNOSIS — F419 Anxiety disorder, unspecified: Secondary | ICD-10-CM | POA: Insufficient documentation

## 2021-08-10 DIAGNOSIS — Z113 Encounter for screening for infections with a predominantly sexual mode of transmission: Secondary | ICD-10-CM

## 2021-08-10 DIAGNOSIS — G4726 Circadian rhythm sleep disorder, shift work type: Secondary | ICD-10-CM | POA: Diagnosis not present

## 2021-08-10 LAB — COMPREHENSIVE METABOLIC PANEL
ALT: 24 U/L (ref 0–53)
AST: 22 U/L (ref 0–37)
Albumin: 4.4 g/dL (ref 3.5–5.2)
Alkaline Phosphatase: 58 U/L (ref 39–117)
BUN: 16 mg/dL (ref 6–23)
CO2: 32 mEq/L (ref 19–32)
Calcium: 9.5 mg/dL (ref 8.4–10.5)
Chloride: 101 mEq/L (ref 96–112)
Creatinine, Ser: 0.99 mg/dL (ref 0.40–1.50)
GFR: 98.73 mL/min (ref >60.00)
Glucose, Bld: 106 mg/dL — ABNORMAL HIGH (ref 70–99)
Potassium: 4.3 mEq/L (ref 3.5–5.1)
Sodium: 138 mEq/L (ref 135–145)
Total Bilirubin: 0.6 mg/dL (ref 0.2–1.2)
Total Protein: 7.5 g/dL (ref 6.0–8.3)

## 2021-08-10 LAB — LIPID PANEL
Cholesterol: 174 mg/dL (ref 0–200)
HDL: 55.3 mg/dL (ref >39.00)
LDL Cholesterol: 107 mg/dL — ABNORMAL HIGH (ref 0–99)
NonHDL: 118.46
Total CHOL/HDL Ratio: 3
Triglycerides: 59 mg/dL (ref 0.0–149.0)
VLDL: 11.8 mg/dL (ref 0.0–40.0)

## 2021-08-10 LAB — CBC
HCT: 46.2 % (ref 39.0–52.0)
Hemoglobin: 15.4 g/dL (ref 13.0–17.0)
MCHC: 33.4 g/dL (ref 30.0–36.0)
MCV: 90.5 fl (ref 78.0–100.0)
Platelets: 225 10*3/uL (ref 150.0–400.0)
RBC: 5.1 Mil/uL (ref 4.22–5.81)
RDW: 14 % (ref 11.5–15.5)
WBC: 3.5 10*3/uL — ABNORMAL LOW (ref 4.0–10.5)

## 2021-08-10 LAB — TSH: TSH: 1.89 u[IU]/mL (ref 0.35–5.50)

## 2021-08-10 LAB — VITAMIN D 25 HYDROXY (VIT D DEFICIENCY, FRACTURES): VITD: 37.92 ng/mL (ref 30.00–100.00)

## 2021-08-10 MED ORDER — CITALOPRAM HYDROBROMIDE 20 MG PO TABS
20.0000 mg | ORAL_TABLET | Freq: Every day | ORAL | 3 refills | Status: DC
Start: 2021-08-10 — End: 2021-09-16

## 2021-08-10 NOTE — Patient Instructions (Signed)
It was very nice to see you today!  We will check blood work and urine sample.  We get your flu vaccine today.  Please start the Celexa if you are not feeling any improvement in the next week or so.  Please send me a message if you decide to do this.  I will see you back in a year.  Come back to see me sooner if needed.  Take care, Dr [name]  PLEASE NOTE:  If you had any lab tests please let us know if you have not heard back within a few days. You may see your results on mychart before we have a chance to review them but we will give you a call once they are reviewed by [place]. If we ordered any referrals today, please let us know if you have not heard from their office within the next week.   Please try these tips to maintain a healthy lifestyle:  Eat at least 3 REAL meals and 1-2 snacks per day.  Aim for no more than 5 hours between eating.  If you eat breakfast, please do so within one hour of getting up.   Each meal should contain half fruits/vegetables, one quarter protein, and one quarter carbs (no bigger than a computer mouse)  Cut down on sweet beverages. This includes juice, soda, and sweet tea.   Drink at least 1 glass of water with each meal and aim for at least 8 glasses per day  Exercise at least 150 minutes every week.    Preventive Care 65-76 Years Old, Male Preventive care refers to lifestyle choices and visits with your health care provider that can promote health and wellness. This includes: A yearly physical exam. This is also called an annual wellness visit. Regular dental and eye exams. Immunizations. Screening for certain conditions. Healthy lifestyle choices, such as: Eating a healthy diet. Getting regular exercise. Not using drugs or products that contain nicotine and tobacco. Limiting alcohol use. What can I expect for my preventive care visit? Physical exam Your health care provider may check your: Height and weight. These may be used to calculate  your BMI (body mass index). BMI is a measurement that tells if you are at a healthy weight. Heart rate and blood pressure. Body temperature. Skin for abnormal spots. Counseling Your health care provider may ask you questions about your: Past medical problems. Family's medical history. Alcohol, tobacco, and drug use. Emotional well-being. Home life and relationship well-being. Sexual activity. Diet, exercise, and sleep habits. Work and work Astronomer. Access to firearms. What immunizations do I need? Vaccines are usually given at various ages, according to a schedule. Your health care provider will recommend vaccines for you based on your age, medical history, and lifestyle or other factors, such as travel or where you work. What tests do I need? Blood tests Lipid and cholesterol levels. These may be checked every 5 years starting at age 28. Hepatitis C test. Hepatitis B test. Screening  Diabetes screening. This is done by checking your blood sugar (glucose) after you have not eaten for a while (fasting). Genital exam to check for testicular cancer or hernias. STD (sexually transmitted disease) testing, if you are at risk. Talk with your health care provider about your test results, treatment options, and if necessary, the need for more tests. Follow these instructions at home: Eating and drinking  Eat a healthy diet that includes fresh fruits and vegetables, whole grains, lean protein, and low-fat dairy products. Drink enough fluid  to keep your urine pale yellow. Take vitamin and mineral supplements as recommended by your health care provider. Do not drink alcohol if your health care provider tells you not to drink. If you drink alcohol: Limit how much you have to 0-2 drinks a day. Be aware of how much alcohol is in your drink. In the [place] one drink equals one 12 oz bottle of beer (355 mL), one 5 oz glass of wine (148 mL), or one 1 oz glass of hard liquor (44  mL). Lifestyle Take daily care of your teeth and gums. Brush your teeth every morning and night with fluoride toothpaste. Floss one time each day. Stay active. Exercise for at least 30 minutes 5 or more days each week. Do not use any products that contain nicotine or tobacco, such as cigarettes, e-cigarettes, and chewing tobacco. If you need help quitting, ask your health care provider. Do not use drugs. If you are sexually active, practice safe sex. Use a condom or other form of protection to prevent STIs (sexually transmitted infections). Find healthy ways to cope with stress, such as: Meditation, yoga, or listening to music. Journaling. Talking to a trusted person. Spending time with friends and family. Safety Always wear your seat belt while driving or riding in a vehicle. Do not drive: If you have been drinking alcohol. Do not ride with someone who has been drinking. When you are tired or distracted. While texting. Wear a helmet and other protective equipment during sports activities. If you have firearms in your house, make sure you follow all gun safety procedures. Seek help if you have been physically or sexually abused. What's next? Go to your health care provider once a year for an annual wellness visit. Ask your health care provider how often you should have your eyes and teeth checked. Stay up to date on all vaccines. This information is not intended to replace advice given to you by your health care provider. Make sure you discuss any questions you have with your health care provider. Document Revised: 01/22/2021 Document Reviewed: 11/08/2018 Elsevier Patient Education  2022 [employer].

## 2021-08-10 NOTE — Progress Notes (Signed)
Chief Complaint:  Manuel Smith is a 35 y.o. male who presents today for his annual comprehensive physical exam.    Assessment/Plan:  Chronic Problems Addressed Today: Vitamin D deficiency Check vitamin D.  He is on 1000 IUs daily.  Anxiety We will check labs today to rule out other possible causes.  He could be also experiencing some jet lag syndrome as well.  We will check labs and see how symptoms progress over the next week or so.  I did send in a prescription for Celexa that he can start if he feels like symptoms or not improving or if they are not resolving over the course of the next several days.  He will send me a message on MyChart to let me know once he starts the Celexa.  Preventative Healthcare: Check labs.  Screen for STI per patient request.  Flu shot given today.  Patient Counseling(The following topics were reviewed and/or handout was given):  -Nutrition: Stressed importance of moderation in sodium/caffeine intake, saturated fat and cholesterol, caloric balance, sufficient intake of fresh fruits, vegetables, and fiber.  -Stressed the importance of regular exercise.   -Substance Abuse: Discussed cessation/primary prevention of tobacco, alcohol, or other drug use; driving or other dangerous activities under the influence; availability of treatment for abuse.   -Injury prevention: Discussed safety belts, safety helmets, smoke detector, smoking near bedding or upholstery.   -Sexuality: Discussed sexually transmitted diseases, partner selection, use of condoms, avoidance of unintended pregnancy and contraceptive alternatives.   -Dental health: Discussed importance of regular tooth brushing, flossing, and dental visits.  -Health maintenance and immunizations reviewed. Please refer to Health maintenance section.  Return to care in 1 year for next preventative visit.     Subjective:  HPI:  He has no acute complaints today.   See A/P for status of chronic conditions. He  recently went to [place]. He state he felt tired after coming back. Associated symptoms are brain fog and fatigue. He had had issue with anxiety and depression before. He is not taking any medication for the symptoms.  Lifestyle Diet: Balanced Exercise: planning to workout   Depression screen [facility] 2/9 08/10/2021  Decreased Interest 1  Down, Depressed, Hopeless 1  PHQ - 2 Score 2  Altered sleeping 1  Tired, decreased energy 2  Change in appetite 1  Feeling bad or failure about yourself  1  Trouble concentrating 2  Moving slowly or fidgety/restless 1  Suicidal thoughts 0  PHQ-9 Score 10  Difficult doing work/chores Somewhat difficult    There are no preventive care reminders to display for this patient.    ROS: Per HPI, otherwise a complete review of systems was negative.   PMH:  The following were reviewed and entered/updated in epic: Past Medical History:  Diagnosis Date   H. 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 pylori infection 06/15/2018   History reviewed. No pertinent surgical history.  Family History  Problem Relation Age of Onset   Uterine cancer Mother    Liver cancer Maternal Grandfather    Stomach cancer Neg Hx    Colon cancer Neg Hx    Pancreatic cancer Neg Hx     Medications- reviewed and updated Current Outpatient Medications  Medication Sig  Dispense Refill   cholecalciferol (VITAMIN D3) 25 MCG (1000 UNIT) tablet Take 1,000 Units by mouth daily.     citalopram (CELEXA) 20 MG tablet Take 1 tablet (20 mg total) by mouth daily. 30 tablet 3   No current facility-administered medications for this visit.    Allergies-reviewed and updated No Known Allergies  Social History   Socioeconomic History   Marital status:  Single    Spouse name: Not on file   Number of children: Not on file   Years of education: Not on file   Highest education level: Not on file  Occupational History    Employer: [employer]  Tobacco Use   Smoking status: Never   Smokeless tobacco: Never  Vaping Use   Vaping Use: Never used  Substance and Sexual Activity   Alcohol use: Never   Drug use: Never   Sexual activity: Not on file  Other Topics Concern   Not on file  Social History Narrative   Single, no children.   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 Wt Readings from Last 3 Encounters:  08/10/21 202 lb 6.4 oz (91.8 kg)  12/25/20 198 lb (89.8 kg)  09/07/20 197 lb 12.8 oz (89.7 kg)   Gen: NAD, resting comfortably HEENT: TMs normal bilaterally. OP clear. No thyromegaly noted.  CV: RRR with no murmurs appreciated Pulm: NWOB, CTAB with no crackles, wheezes, or rhonchi GI: Normal bowel sounds present. Soft, Nontender, Nondistended. MSK: no edema, cyanosis, or clubbing noted Skin: warm, dry Neuro: CN2-12 grossly intact. Strength 5/5 in upper and lower extremities. Reflexes symmetric and intact bilaterally.  Psych: Normal affect and thought content      I,Savera Zaman,acting as a scribe for 11/07/20, MD.,have documented all relevant documentation on the behalf of Jacquiline Doe, MD,as directed by  Jacquiline Doe, MD while in the presence of Jacquiline Doe, MD.   I,  Jacquiline Doe, MD, have reviewed all documentation for this visit. The documentation on 08/10/21 for the exam, diagnosis, procedures, and orders are all accurate and complete.  08/12/21. Katina Degree, MD 08/10/2021 12:31 PM

## 2021-08-10 NOTE — Assessment & Plan Note (Signed)
Check vitamin D.  He is on 1000 IUs daily.

## 2021-08-10 NOTE — Assessment & Plan Note (Signed)
We will check labs today to rule out other possible causes.  He could be also experiencing some jet lag syndrome as well.  We will check labs and see how symptoms progress over the next week or so.  I did send in a prescription for Celexa that he can start if he feels like symptoms or not improving or if they are not resolving over the course of the next several days.  He will send me a message on MyChart to let me know once he starts the Celexa.

## 2021-08-11 LAB — URINE CYTOLOGY ANCILLARY ONLY
Chlamydia: NEGATIVE
Comment: NEGATIVE
Comment: NORMAL
Neisseria Gonorrhea: NEGATIVE

## 2021-08-12 LAB — FLUORESCENT TREPONEMAL AB(FTA)-IGG-BLD: Fluorescent Treponemal ABS: REACTIVE — AB

## 2021-08-12 LAB — RPR TITER: RPR Titer: 1:2 {titer} — ABNORMAL HIGH

## 2021-08-12 LAB — RPR: RPR Ser Ql: REACTIVE — AB

## 2021-08-12 LAB — HIV ANTIBODY (ROUTINE TESTING W REFLEX): HIV 1&2 Ab, 4th Generation: NONREACTIVE

## 2021-08-13 ENCOUNTER — Encounter: Payer: Self-pay | Admitting: Family Medicine

## 2021-08-16 ENCOUNTER — Encounter: Payer: Self-pay | Admitting: Family Medicine

## 2021-08-16 DIAGNOSIS — R739 Hyperglycemia, unspecified: Secondary | ICD-10-CM | POA: Insufficient documentation

## 2021-08-16 DIAGNOSIS — E785 Hyperlipidemia, unspecified: Secondary | ICD-10-CM | POA: Insufficient documentation

## 2021-08-16 NOTE — Telephone Encounter (Signed)
See lab results note.

## 2021-08-16 NOTE — Progress Notes (Signed)
Please inform patient of the following:  His syphilis test is positive but improving compared to last time was checked.  The treatment he received is working appropriately.  His blood sugar and cholesterol are both borderline.  We can recheck again in a year.  All of his other tests are negative.  Katina Degree. [name], MD 08/16/2021 8:08 AM

## 2021-09-16 ENCOUNTER — Encounter (HOSPITAL_COMMUNITY): Payer: Self-pay | Admitting: Emergency Medicine

## 2021-09-16 ENCOUNTER — Ambulatory Visit (HOSPITAL_COMMUNITY)
Admission: EM | Admit: 2021-09-16 | Discharge: 2021-09-16 | Disposition: A | Discharge status: Home / Self Care | Payer: BC Managed Care – PPO | Attending: Emergency Medicine | Admitting: Emergency Medicine

## 2021-09-16 ENCOUNTER — Other Ambulatory Visit: Payer: Self-pay

## 2021-09-16 DIAGNOSIS — R21 Rash and other nonspecific skin eruption: Secondary | ICD-10-CM

## 2021-09-16 MED ORDER — PREDNISONE 20 MG PO TABS
40.0000 mg | ORAL_TABLET | Freq: Every day | ORAL | 0 refills | Status: DC
Start: 2021-09-16 — End: 2021-11-02

## 2021-09-16 MED ORDER — CEPHALEXIN 500 MG PO CAPS: 1000.0000 mg | ORAL_CAPSULE | Freq: Two times a day (BID) | ORAL | 0 refills | Status: DC

## 2021-09-16 NOTE — ED Provider Notes (Signed)
[facility]    CSN: [id number] Arrival date & time: 09/16/21  0820      History   Chief Complaint Chief Complaint  Patient presents with   Rash    HPI Manuel Smith is a 35 y.o. male.   Patient presents with rash to left upper thigh, flat and reddened present for 5 days. denies pruritus.,  Endorses pain only with palpation.  Denies drainage, fever or chills.  Rash has not spread from area.  Only change in routine is as increased exercise, not consistent with wiping down gym equipment.  No changes in soaps, lotions, detergents, fragrances, linens, diet or recent travel.  Past Medical History:  Diagnosis Date   H. 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 pylori infection 06/15/2018    History reviewed. No pertinent surgical history.     Home Medications    Prior to Admission medications   Medication Sig Start Date End Date Taking? Authorizing Provider  cephALEXin (KEFLEX) 500 MG capsule Take 2 capsules (1,000 mg total) by mouth 2 (two) times daily. 09/16/21  Yes Kiondra Caicedo R, NP  predniSONE (DELTASONE) 20 MG tablet Take 2 tablets (40 mg total) by mouth daily. 09/16/21  Yes Dionne Knoop R, NP  cholecalciferol (VITAMIN D3) 25 MCG (1000 UNIT) tablet Take 1,000 Units by mouth daily.    [provider]    Family History Family History  Problem Relation Age of Onset   Uterine cancer Mother    Liver cancer Maternal Grandfather    Stomach cancer Neg Hx    Colon cancer Neg Hx    Pancreatic cancer Neg Hx     Social History Social History   Tobacco Use   Smoking status: Never   Smokeless tobacco: Never  Vaping Use   Vaping Use: Never used   Substance Use Topics   Alcohol use: Never   Drug use: Never     Allergies   Patient has no known allergies.   Review of Systems Review of Systems  Constitutional: Negative.   Respiratory: Negative.    Cardiovascular: Negative.   Genitourinary: Negative.   Skin:  Positive for rash. Negative for color change, pallor and wound.  Neurological: Negative.     Physical Exam Triage Vital Signs ED Triage Vitals  Enc Vitals Group     BP 09/16/21 0856 129/66     Pulse Rate 09/16/21 0854 68     Resp 09/16/21 0854 18     Temp 09/16/21 0854 97.9 F (36.6 C)     Temp src --      SpO2 09/16/21 0854 98 %     Weight --      Height --      Head Circumference --      Peak Flow --      Pain Score 09/16/21 0853 0     Pain Loc --      Pain Edu? --      Excl. in GC? --    No data found.  Updated Vital Signs BP 129/66   Pulse 68   Temp 97.9 F (36.6 C)   Resp 18   SpO2 98%   Visual  Acuity Right Eye Distance:   Left Eye Distance:   Bilateral Distance:    Right Eye Near:   Left Eye Near:    Bilateral Near:     Physical Exam Constitutional:      Appearance: Normal appearance. He is normal weight.  HENT:     Head: Normocephalic.  Eyes:     Extraocular Movements: Extraocular movements intact.  Pulmonary:     Effort: Pulmonary effort is normal.  Skin:    Comments: Defer to photo below  Neurological:     Mental Status: He is alert and oriented to person, place, and time. Mental status is at baseline.  Psychiatric:        Mood and Affect: Mood normal.        Behavior: Behavior normal.      UC Treatments / Results  Labs (all labs ordered are listed, but only abnormal results are displayed) Labs Reviewed  AEROBIC CULTURE W GRAM STAIN (SUPERFICIAL SPECIMEN)    EKG   Radiology No results found.  Procedures Procedures (including critical care time)  Medications Ordered in UC Medications - No data to display  Initial Impression / Assessment and Plan / UC  Course  I have reviewed the triage vital signs and the nursing notes.  Pertinent labs & imaging results that were available during my care of the patient were reviewed by me and considered in my medical decision making (see chart for details).  Rash  Etiology unknown, wound culture collected, will prescribe short round of steroids and cover for infection, patient to follow-up as needed  1.  Keflex 1000 mg twice daily for 5 days 2.  Prednisone 40 mg daily for 5 days Final Clinical Impressions(s) / UC Diagnoses   Final diagnoses:  Rash     Discharge Instructions      Your rash site has been tested for bacteria, it may take up to 3 to 4 days for these results to come back, you will be contacted if any bacteria is seen and if any changes to your medication needs to occur  Start taking prednisone every morning with food for the next 5 days  Begin taking cephalexin twice a day for the next 5 days  While skin is open on you may cover with Band-Aid to prevent further infection  You may follow-up with urgent care as needed if rash worsens or begins to spread   ED Prescriptions     Medication Sig Dispense Auth. Provider   predniSONE (DELTASONE) 20 MG tablet Take 2 tablets (40 mg total) by mouth daily. 10 tablet Issa Luster R, NP   cephALEXin (KEFLEX) 500 MG capsule Take 2 capsules (1,000 mg total) by mouth 2 (two) times daily. 20 capsule Valinda Hoar, NP      PDMP not reviewed this encounter.   Valinda Hoar, [place] 09/16/21 [phone]

## 2021-09-16 NOTE — Discharge Instructions (Addendum)
Your rash site has been tested for bacteria, it may take up to 3 to 4 days for these results to come back, you will be contacted if any bacteria is seen and if any changes to your medication needs to occur  Start taking prednisone every morning with food for the next 5 days  Begin taking cephalexin twice a day for the next 5 days  While skin is open on you may cover with Band-Aid to prevent further infection  You may follow-up with urgent care as needed if rash worsens or begins to spread

## 2021-09-16 NOTE — ED Triage Notes (Signed)
Pt is present today with a rash on his left thigh that he noticed 4 days ago.

## 2021-09-18 LAB — AEROBIC CULTURE W GRAM STAIN (SUPERFICIAL SPECIMEN): Culture: NO GROWTH

## 2021-11-02 ENCOUNTER — Ambulatory Visit
Admission: EM | Admit: 2021-11-02 | Discharge: 2021-11-02 | Disposition: A | Discharge status: Home / Self Care | Payer: BC Managed Care – PPO | Attending: Emergency Medicine | Admitting: Emergency Medicine

## 2021-11-02 ENCOUNTER — Other Ambulatory Visit: Payer: Self-pay

## 2021-11-02 DIAGNOSIS — Z113 Encounter for screening for infections with a predominantly sexual mode of transmission: Secondary | ICD-10-CM | POA: Insufficient documentation

## 2021-11-02 DIAGNOSIS — M545 Low back pain, unspecified: Secondary | ICD-10-CM | POA: Diagnosis not present

## 2021-11-02 DIAGNOSIS — Z8619 Personal history of other infectious and parasitic diseases: Secondary | ICD-10-CM | POA: Diagnosis not present

## 2021-11-02 LAB — POCT URINALYSIS DIP (MANUAL ENTRY)
Bilirubin, UA: NEGATIVE
Glucose, UA: NEGATIVE mg/dL
Leukocytes, UA: NEGATIVE
Nitrite, UA: NEGATIVE
Protein Ur, POC: NEGATIVE mg/dL
Spec Grav, UA: 1.025 (ref 1.010–1.025)
Urobilinogen, UA: 0.2 E.U./dL
pH, UA: 6.5 (ref 5.0–8.0)

## 2021-11-02 MED ORDER — METHYLPREDNISOLONE 4 MG PO TBPK: ORAL_TABLET | ORAL | 0 refills | Status: DC

## 2021-11-02 NOTE — Discharge Instructions (Addendum)
To help resolve the rest of your lower back pain, I provided you with a prescription for a Medrol Dosepak, a tapering dose of steroids which should calm the inflammation in the muscles in your right lower back and relieve the rest of your pain.    I also provided you with information regarding rehab exercises that you can try at home when she pain is completely relieved.    As we discussed, I also recommend that she apply ice to your lower back and groin area 3-4 times a day, for 20 minutes each time.    You can make a homemade ice pack by using a gallon sized Ziploc freezer bag, fill it with half Dawn dish detergent and half isopropyl rubbing alcohol available at your pharmacy.  Once frozen, this creates a cold slushy ice pack that conforms nicely to rounded areas.  The results of your STD testing today will be made available to you once received.  They will be posted to your MyChart and, if any of your results are abnormal, you will receive a phone call with those results along with further instructions regarding treatment.  The result of the urine culture that we performed will also be available in the next few days and posted to your MyChart.  If there are any positive findings, you will be contacted by phone and antibiotics will be provided for treatment.

## 2021-11-02 NOTE — ED Triage Notes (Signed)
Pt reports he has been having lower back pain and right groin pain for about a week. Patient requesting to be tested for STDs.

## 2021-11-02 NOTE — ED Provider Notes (Signed)
[facility]    CSN: [id number] Arrival date & time: 11/02/21  1004    HISTORY  No chief complaint on file.  HPI Manuel Smith is a 35 y.o. male. Patient states he was performing weight assisted squats at the gym, states the next day he had significant lower back pain that radiated to his right groin and right inner thigh, states this was 10 days ago, has not repeated any squats since then.  Patient states overall his lower back feels better however he still has some mild pain in his right groin.  Patient reports a history of syphilis, was tested positive and treated 3 months ago, patient denies penile discharge, burning with urination, frank scrotal swelling, would like to be repeat tested for STDs at this time.  Patient states that he feels as possible that he has some pain in his right scrotum/testicle but it is difficult to tell.  Patient denies lower back injury in the past.  Per my observation, patient is quite tall.  Urine dip today was positive for trace ketones and blood.  The history is provided by the patient.  Past Medical History:  Diagnosis Date   H. 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 pylori infection 06/15/2018   History reviewed. No pertinent surgical history.  Home Medications    Prior to Admission medications   Medication Sig Start Date End Date Taking? Authorizing Provider  methylPREDNISolone (MEDROL DOSEPAK) 4 MG TBPK tablet Take 24 mg on day 1, 20 mg on day 2, 16 mg on day 3, 12 mg on day 4, 8 mg on day 5, 4 mg on day 6. 11/02/21  Yes Theadora Rama Scales, PA-C   Family History Family History  Problem Relation Age of Onset   Uterine cancer Mother    Liver  cancer Maternal Grandfather    Stomach cancer Neg Hx    Colon cancer Neg Hx    Pancreatic cancer Neg Hx    Social History Social History   Tobacco Use   Smoking status: Never   Smokeless tobacco: Never  Vaping Use   Vaping Use: Never used  Substance Use Topics   Alcohol use: Never   Drug use: Never   Allergies   Patient has no known allergies.  Review of Systems Review of Systems Pertinent findings noted in history of present illness.   Physical Exam Triage Vital Signs ED Triage Vitals  Enc Vitals Group     BP 09/24/21 0827 (!) 147/82     Pulse Rate 09/24/21 0827 72     Resp 09/24/21 0827 18     Temp 09/24/21 0827 98.3 F (36.8 C)     Temp Source 09/24/21 0827 Oral     SpO2 09/24/21 0827 98 %     Weight --      Height --      Head Circumference --      Peak Flow --      Pain Score 09/24/21 0826 5     Pain Loc --      Pain Edu? --      Excl. in GC? --   No data found.  Updated Vital Signs BP 126/82 (BP  Location: Right Arm)   Pulse 82   Temp 99.3 F (37.4 C) (Oral)   Resp 18   SpO2 97%   Physical Exam Vitals and nursing note reviewed.  Constitutional:      General: He is not in acute distress.    Appearance: Normal appearance. He is not ill-appearing.  HENT:     Head: Normocephalic and atraumatic.  Eyes:     General: Lids are normal.        Right eye: No discharge.        Left eye: No discharge.     Extraocular Movements: Extraocular movements intact.     Conjunctiva/sclera: Conjunctivae normal.     Right eye: Right conjunctiva is not injected.     Left eye: Left conjunctiva is not injected.  Neck:     Trachea: Trachea and phonation normal.  Cardiovascular:     Rate and Rhythm: Normal rate and regular rhythm.     Pulses: Normal pulses.     Heart sounds: Normal heart sounds. No murmur heard.   No friction rub. No gallop.  Pulmonary:     Effort: Pulmonary effort is normal. No accessory muscle usage, prolonged expiration or respiratory distress.      Breath sounds: Normal breath sounds. No stridor, decreased air movement or transmitted upper airway sounds. No decreased breath sounds, wheezing, rhonchi or rales.  Chest:     Chest wall: No tenderness.  Abdominal:     General: Abdomen is flat. Bowel sounds are normal. There is no distension.     Palpations: Abdomen is soft.     Tenderness: There is no abdominal tenderness. There is no right CVA tenderness or left CVA tenderness.     Hernia: No hernia is present.  Musculoskeletal:        General: Normal range of motion.     Cervical back: Normal range of motion and neck supple. Normal range of motion.     Comments: Pain in right upper thigh with adduction  Lymphadenopathy:     Cervical: No cervical adenopathy.  Skin:    General: Skin is warm and dry.     Findings: No erythema or rash.  Neurological:     General: No focal deficit present.     Mental Status: He is alert and oriented to person, place, and time.  Psychiatric:        Mood and Affect: Mood normal.        Behavior: Behavior normal.    Visual Acuity Right Eye Distance:   Left Eye Distance:   Bilateral Distance:    Right Eye Near:   Left Eye Near:    Bilateral Near:     UC Couse / Diagnostics / Procedures:    EKG  Radiology No results found.  Procedures Procedures (including critical care time)  UC Diagnoses / Final Clinical Impressions(s)   I have reviewed the triage vital signs and the nursing notes.  Pertinent labs & imaging results that were available during my care of the patient were reviewed by me and considered in my medical decision making (see chart for details).    Final diagnoses:  Acute low back pain, unspecified back pain laterality, unspecified whether sciatica present  History of syphilis  Screening examination for STD (sexually transmitted disease)   Suspect pain is had a strain to his lower back, possible piriformis strain.  Patient provided with exercises to try at home after  completing Medrol Dosepak to relieve the rest of his pain and  inflammation.  I also recommend ice pack to back 3-4 times daily for 20 minutes.  Patient advised to follow-up primary care if no improvement after completing Medrol Dosepak.  STD screening performed as requested.  Patient advised to be treated as needed if there are any positive findings for acute urinary tract infection or STD.  ED Prescriptions     Medication Sig Dispense Auth. Provider   methylPREDNISolone (MEDROL DOSEPAK) 4 MG TBPK tablet Take 24 mg on day 1, 20 mg on day 2, 16 mg on day 3, 12 mg on day 4, 8 mg on day 5, 4 mg on day 6. 21 tablet Theadora Rama Scales, PA-C      PDMP not reviewed this encounter.  Pending results:  Labs Reviewed  POCT URINALYSIS DIP (MANUAL ENTRY) - Abnormal; Notable for the following components:      Result Value   Ketones, POC UA trace (5) (*)    Blood, UA trace-intact (*)    All other components within normal limits  URINE CULTURE  HIV ANTIBODY (ROUTINE TESTING W REFLEX)  RPR  CYTOLOGY, (ORAL, ANAL, URETHRAL) ANCILLARY ONLY    Medications Ordered in UC: Medications - No data to display  Disposition Upon Discharge:  Condition: stable for discharge home Home: take medications as prescribed; routine discharge instructions as discussed; follow up as advised.  Patient presented with an acute illness with associated systemic symptoms and significant discomfort requiring urgent management. In my opinion, this is a condition that a prudent lay person (someone who possesses an average knowledge of health and medicine) may potentially expect to result in complications if not addressed urgently such as respiratory distress, impairment of bodily function or dysfunction of bodily organs.   Routine symptom specific, illness specific and/or disease specific instructions were discussed with the patient and/or caregiver at length.   As such, the patient has been evaluated and assessed, work-up was  performed and treatment was provided in alignment with urgent care protocols and evidence based medicine.  Patient/parent/caregiver has been advised that the patient may require follow up for further testing and treatment if the symptoms continue in spite of treatment, as clinically indicated and appropriate.  If the patient was tested for COVID-19, Influenza and/or RSV, then the patient/parent/guardian was advised to isolate at home pending the results of his/her diagnostic coronavirus test and potentially longer if they're positive. I have also advised pt that if his/her COVID-19 test returns positive, it's recommended to self-isolate for at least 10 days after symptoms first appeared AND until fever-free for 24 hours without fever reducer AND other symptoms have improved or resolved. Discussed self-isolation recommendations as well as instructions for household member/close contacts as per the [facility] and [place] DHHS, and also gave patient the COVID packet with this information.  Patient/parent/caregiver has been advised to return to the [facility] or PCP in 3-5 days if no better; to PCP or the Emergency Department if new signs and symptoms develop, or if the current signs or symptoms continue to change or worsen for further workup, evaluation and treatment as clinically indicated and appropriate  The patient will follow up with their current PCP if and as advised. If the patient does not currently have a PCP we will assist them in obtaining one.   The patient may need specialty follow up if the symptoms continue, in spite of conservative treatment and management, for further workup, evaluation, consultation and treatment as clinically indicated and appropriate.   Patient/parent/caregiver verbalized understanding and agreement of plan as  discussed.  All questions were addressed during visit.  Please see discharge instructions below for further details of plan.  Discharge Instructions:   Discharge Instructions       To help resolve the rest of your lower back pain, I provided you with a prescription for a Medrol Dosepak, a tapering dose of steroids which should calm the inflammation in the muscles in your right lower back and relieve the rest of your pain.    I also provided you with information regarding rehab exercises that you can try at home when she pain is completely relieved.    As we discussed, I also recommend that she apply ice to your lower back and groin area 3-4 times a day, for 20 minutes each time.    You can make a homemade ice pack by using a gallon sized Ziploc freezer bag, fill it with half Dawn dish detergent and half isopropyl rubbing alcohol available at your pharmacy.  Once frozen, this creates a cold slushy ice pack that conforms nicely to rounded areas.  The results of your STD testing today will be made available to you once received.  They will be posted to your MyChart and, if any of your results are abnormal, you will receive a phone call with those results along with further instructions regarding treatment.  The result of the urine culture that we performed will also be available in the next few days and posted to your MyChart.  If there are any positive findings, you will be contacted by phone and antibiotics will be provided for treatment.         Theadora Rama Scales, PA-C 11/02/21 1304

## 2021-11-03 LAB — CYTOLOGY, (ORAL, ANAL, URETHRAL) ANCILLARY ONLY
Chlamydia: NEGATIVE
Comment: NEGATIVE
Comment: NEGATIVE
Comment: NORMAL
Neisseria Gonorrhea: NEGATIVE
Trichomonas: NEGATIVE

## 2021-11-03 LAB — URINE CULTURE: Culture: NO GROWTH

## 2021-11-04 LAB — RPR: RPR Ser Ql: REACTIVE — AB

## 2021-11-04 LAB — HIV ANTIBODY (ROUTINE TESTING W REFLEX): HIV Screen 4th Generation wRfx: NONREACTIVE

## 2021-11-04 LAB — RPR, QUANT+TP ABS (REFLEX)
Rapid Plasma Reagin, Quant: 1:1 {titer} — ABNORMAL HIGH
T Pallidum Abs: REACTIVE — AB

## 2022-08-11 ENCOUNTER — Other Ambulatory Visit: Payer: Self-pay

## 2022-08-11 ENCOUNTER — Encounter: Payer: Self-pay | Admitting: Family Medicine

## 2022-08-11 ENCOUNTER — Other Ambulatory Visit (HOSPITAL_COMMUNITY)
Admission: RE | Admit: 2022-08-11 | Discharge: 2022-08-11 | Disposition: A | Discharge status: Home / Self Care | Payer: BC Managed Care – PPO | Source: Ambulatory Visit | Attending: Family Medicine | Admitting: Family Medicine

## 2022-08-11 ENCOUNTER — Telehealth: Payer: Self-pay | Admitting: Family Medicine

## 2022-08-11 ENCOUNTER — Ambulatory Visit (INDEPENDENT_AMBULATORY_CARE_PROVIDER_SITE_OTHER): Payer: BC Managed Care – PPO | Admitting: Family Medicine

## 2022-08-11 VITALS — BP 118/77 | HR 79 | Temp 98.1°F | Ht 76.0 in | Wt 209.6 lb

## 2022-08-11 DIAGNOSIS — E785 Hyperlipidemia, unspecified: Secondary | ICD-10-CM

## 2022-08-11 DIAGNOSIS — Z0001 Encounter for general adult medical examination with abnormal findings: Secondary | ICD-10-CM | POA: Diagnosis not present

## 2022-08-11 DIAGNOSIS — R059 Cough, unspecified: Secondary | ICD-10-CM | POA: Diagnosis not present

## 2022-08-11 DIAGNOSIS — Z113 Encounter for screening for infections with a predominantly sexual mode of transmission: Secondary | ICD-10-CM

## 2022-08-11 DIAGNOSIS — Z7251 High risk heterosexual behavior: Secondary | ICD-10-CM | POA: Insufficient documentation

## 2022-08-11 DIAGNOSIS — R739 Hyperglycemia, unspecified: Secondary | ICD-10-CM

## 2022-08-11 DIAGNOSIS — Z23 Encounter for immunization: Secondary | ICD-10-CM

## 2022-08-11 DIAGNOSIS — Z0000 Encounter for general adult medical examination without abnormal findings: Secondary | ICD-10-CM

## 2022-08-11 DIAGNOSIS — E559 Vitamin D deficiency, unspecified: Secondary | ICD-10-CM

## 2022-08-11 LAB — CBC
HCT: 43 % (ref 39.0–52.0)
Hemoglobin: 14.8 g/dL (ref 13.0–17.0)
MCHC: 34.4 g/dL (ref 30.0–36.0)
MCV: 94.6 fl (ref 78.0–100.0)
Platelets: 209 10*3/uL (ref 150.0–400.0)
RBC: 4.55 Mil/uL (ref 4.22–5.81)
RDW: 13.1 % (ref 11.5–15.5)
WBC: 4.5 10*3/uL (ref 4.0–10.5)

## 2022-08-11 LAB — COMPREHENSIVE METABOLIC PANEL
ALT: 44 U/L (ref 0–53)
AST: 33 U/L (ref 0–37)
Albumin: 4.2 g/dL (ref 3.5–5.2)
Alkaline Phosphatase: 67 U/L (ref 39–117)
BUN: 15 mg/dL (ref 6–23)
CO2: 32 mEq/L (ref 19–32)
Calcium: 9.3 mg/dL (ref 8.4–10.5)
Chloride: 102 mEq/L (ref 96–112)
Creatinine, Ser: 0.96 mg/dL (ref 0.40–1.50)
GFR: 101.72 mL/min (ref >60.00)
Glucose, Bld: 86 mg/dL (ref 70–99)
Potassium: 3.9 mEq/L (ref 3.5–5.1)
Sodium: 140 mEq/L (ref 135–145)
Total Bilirubin: 0.8 mg/dL (ref 0.2–1.2)
Total Protein: 7.6 g/dL (ref 6.0–8.3)

## 2022-08-11 LAB — LIPID PANEL
Cholesterol: 151 mg/dL (ref 0–200)
HDL: 49.8 mg/dL (ref >39.00)
LDL Cholesterol: 93 mg/dL (ref 0–99)
NonHDL: 101.22
Total CHOL/HDL Ratio: 3
Triglycerides: 41 mg/dL (ref 0.0–149.0)
VLDL: 8.2 mg/dL (ref 0.0–40.0)

## 2022-08-11 LAB — HEMOGLOBIN A1C: Hgb A1c MFr Bld: 5.6 % (ref 4.6–6.5)

## 2022-08-11 LAB — TSH: TSH: 2.26 u[IU]/mL (ref 0.35–5.50)

## 2022-08-11 LAB — POC COVID19 BINAXNOW: SARS Coronavirus 2 Ag: NEGATIVE

## 2022-08-11 MED ORDER — AMOXICILLIN-POT CLAVULANATE 875-125 MG PO TABS: 1.0000 | ORAL_TABLET | Freq: Two times a day (BID) | ORAL | 0 refills | Status: DC

## 2022-08-11 MED ORDER — AZELASTINE HCL 0.1 % NA SOLN: 2.0000 | Freq: Two times a day (BID) | NASAL | 12 refills | Status: DC

## 2022-08-11 NOTE — Patient Instructions (Signed)
It was very nice to see you today!  You have a sinus infection.  Please start the nasal spray and antibiotic.  Please make sure that you are getting plenty of fluids let me know if not improving  We will give your flu shot today.  We will check blood work today.  Please let me know you need a refill on your Truvada.  I will see back in year for your next physical.  Come back sooner if needed.  Take care, Dr [name]  PLEASE NOTE:  If you had any lab tests please let us know if you have not heard back within a few days. You may see your results on mychart before we have a chance to review them but we will give you a call once they are reviewed by [place]. If we ordered any referrals today, please let us know if you have not heard from their office within the next week.   Please try these tips to maintain a healthy lifestyle:  Eat at least 3 REAL meals and 1-2 snacks per day.  Aim for no more than 5 hours between eating.  If you eat breakfast, please do so within one hour of getting up.   Each meal should contain half fruits/vegetables, one quarter protein, and one quarter carbs (no bigger than a computer mouse)  Cut down on sweet beverages. This includes juice, soda, and sweet tea.   Drink at least 1 glass of water with each meal and aim for at least 8 glasses per day  Exercise at least 150 minutes every week.    Preventive Care 13-3 Years Old, Male Preventive care refers to lifestyle choices and visits with your health care provider that can promote health and wellness. Preventive care visits are also called wellness exams. What can I expect for my preventive care visit? Counseling During your preventive care visit, your health care provider may ask about your: Medical history, including: Past medical problems. Family medical history. Current health, including: Emotional well-being. Home life and relationship well-being. Sexual activity. Lifestyle, including: Alcohol, nicotine  or tobacco, and drug use. Access to firearms. Diet, exercise, and sleep habits. Safety issues such as seatbelt and bike helmet use. Sunscreen use. Work and work Astronomer. Physical exam Your health care provider may check your: Height and weight. These may be used to calculate your BMI (body mass index). BMI is a measurement that tells if you are at a healthy weight. Waist circumference. This measures the distance around your waistline. This measurement also tells if you are at a healthy weight and may help predict your risk of certain diseases, such as type 2 diabetes and high blood pressure. Heart rate and blood pressure. Body temperature. Skin for abnormal spots. What immunizations do I need?  Vaccines are usually given at various ages, according to a schedule. Your health care provider will recommend vaccines for you based on your age, medical history, and lifestyle or other factors, such as travel or where you work. What tests do I need? Screening Your health care provider may recommend screening tests for certain conditions. This may include: Lipid and cholesterol levels. Diabetes screening. This is done by checking your blood sugar (glucose) after you have not eaten for a while (fasting). Hepatitis B test. Hepatitis C test. HIV (human immunodeficiency virus) test. STI (sexually transmitted infection) testing, if you are at risk. Talk with your health care provider about your test results, treatment options, and if necessary, the need for more tests. Follow  these instructions at home: Eating and drinking  Eat a healthy diet that includes fresh fruits and vegetables, whole grains, lean protein, and low-fat dairy products. Drink enough fluid to keep your urine pale yellow. Take vitamin and mineral supplements as recommended by your health care provider. Do not drink alcohol if your health care provider tells you not to drink. If you drink alcohol: Limit how much you have to  0-2 drinks a day. Know how much alcohol is in your drink. In the [place] one drink equals one 12 oz bottle of beer (355 mL), one 5 oz glass of wine (148 mL), or one 1 oz glass of hard liquor (44 mL). Lifestyle Brush your teeth every morning and night with fluoride toothpaste. Floss one time each day. Exercise for at least 30 minutes 5 or more days each week. Do not use any products that contain nicotine or tobacco. These products include cigarettes, chewing tobacco, and vaping devices, such as e-cigarettes. If you need help quitting, ask your health care provider. Do not use drugs. If you are sexually active, practice safe sex. Use a condom or other form of protection to prevent STIs. Find healthy ways to manage stress, such as: Meditation, yoga, or listening to music. Journaling. Talking to a trusted person. Spending time with friends and family. Minimize exposure to UV radiation to reduce your risk of skin cancer. Safety Always wear your seat belt while driving or riding in a vehicle. Do not drive: If you have been drinking alcohol. Do not ride with someone who has been drinking. If you have been using any mind-altering substances or drugs. While texting. When you are tired or distracted. Wear a helmet and other protective equipment during sports activities. If you have firearms in your house, make sure you follow all gun safety procedures. Seek help if you have been physically or sexually abused. What's next? Go to your health care provider once a year for an annual wellness visit. Ask your health care provider how often you should have your eyes and teeth checked. Stay up to date on all vaccines. This information is not intended to replace advice given to you by your health care provider. Make sure you discuss any questions you have with your health care provider. Document Revised: 05/12/2021 Document Reviewed: 05/12/2021 Elsevier Patient Education  2023 [employer].

## 2022-08-11 NOTE — Progress Notes (Signed)
Chief Complaint:  Manuel Smith is a 36 y.o. male who presents today for his annual comprehensive physical exam.    Assessment/Plan:  New/Acute Problems: Sinusitis  No red flags.  COVID test negative.  Given symptoms of been persistent for the last 2 weeks we will treat with course of antibiotics and Astelin.  Encouraged hydration.  He will let me know if not improving.  Chronic Problems Addressed Today: High risk sexual behavior He has been started on Truvada since his last visit.  He would like for [place] to take over this prescription.  This is reasonable.  We will check labs today.  Does not need refill today.  He will come back in 3 months to recheck labs.  He will let me know when he needs refill.  Hyperglycemia Check A1c.  Dyslipidemia Check lipids.  Preventative Healthcare: Flu shot given today.  Check labs.  Patient Counseling(The following topics were reviewed and/or handout was given):  -Nutrition: Stressed importance of moderation in sodium/caffeine intake, saturated fat and cholesterol, caloric balance, sufficient intake of fresh fruits, vegetables, and fiber.  -Stressed the importance of regular exercise.   -Substance Abuse: Discussed cessation/primary prevention of tobacco, alcohol, or other drug use; driving or other dangerous activities under the influence; availability of treatment for abuse.   -Injury prevention: Discussed safety belts, safety helmets, smoke detector, smoking near bedding or upholstery.   -Sexuality: Discussed sexually transmitted diseases, partner selection, use of condoms, avoidance of unintended pregnancy and contraceptive alternatives.   -Dental health: Discussed importance of regular tooth brushing, flossing, and dental visits.  -Health maintenance and immunizations reviewed. Please refer to Health maintenance section.  Return to care in 1 year for next preventative visit.     Subjective:  HPI:  He has no acute complaints today.   He has  also been having issues with cough and runny nose for the last 2-3 weeks. Some sore throat. Some subjective fevers. Tried taking mucinex without much improvement.   Lifestyle Diet: Balanced.      08/11/2022    9:02 AM  Depression screen PHQ 2/9  Decreased Interest 0  Down, Depressed, Hopeless 0  PHQ - 2 Score 0    ROS: Per HPI, otherwise a complete review of systems was negative.   PMH:  The following were reviewed and entered/updated in epic: Past Medical History:  Diagnosis Date   H. 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 pylori infection 06/15/2018   History reviewed. No pertinent surgical history.  Family History  Problem Relation Age of Onset   Uterine cancer Mother    Liver cancer Maternal Grandfather    Stomach cancer Neg Hx    Colon cancer Neg Hx    Pancreatic cancer Neg Hx     Medications- reviewed and updated Current Outpatient Medications  Medication Sig Dispense Refill   amoxicillin-clavulanate (AUGMENTIN) 875-125 MG tablet Take 1 tablet by mouth 2 (two) times daily. 20 tablet 0   azelastine (ASTELIN) 0.1 % nasal spray Place 2 sprays into both nostrils 2 (two) times daily. 30 mL 12   emtricitabine-tenofovir (TRUVADA) 200-300 MG tablet Take 1 tablet by mouth daily.     No current facility-administered medications  for this visit.    Allergies-reviewed and updated No Known Allergies  Social History   Socioeconomic History   Marital status: Single    Spouse name: Not on file   Number of children: Not on file   Years of education: Not on file   Highest education level: Not on file  Occupational History    Employer: [employer]  Tobacco Use   Smoking status: Never    Smokeless tobacco: Never  Vaping Use   Vaping Use: Never used  Substance and Sexual Activity   Alcohol use: Never   Drug use: Never   Sexual activity: Not on file  Other Topics Concern   Not on file  Social History Narrative   Single, no children.   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 Wt Readings from Last 3 Encounters:  08/11/22 209 lb 9.6 oz (95.1 kg)  08/10/21 202 lb 6.4 oz (91.8 kg)  12/25/20 198 lb (89.8 kg)   Gen: NAD, resting comfortably HEENT: TMs normal bilaterally. OP clear. No thyromegaly noted.  CV: RRR with no murmurs appreciated Pulm: NWOB, CTAB with no crackles, wheezes, or rhonchi GI: Normal bowel sounds present. Soft, Nontender, Nondistended. MSK: no edema, cyanosis, or clubbing noted Skin: warm, dry Neuro: CN2-12 grossly intact. Strength 5/5 in upper and lower extremities. Reflexes symmetric and intact bilaterally.  Psych: Normal affect and thought content     Zarayah Lanting M. 12/27/20, MD 08/11/2022 10:15 AM

## 2022-08-11 NOTE — Assessment & Plan Note (Signed)
Check lipids 

## 2022-08-11 NOTE — Telephone Encounter (Signed)
Caller is Eleana from [facility]   Caller states: - Patient's throat swab for chlamydia and gonorrhea was rejected due to being ordered incorrectly - Lab sent a purple top instead of an orange top which is for appimaslobs   They will be sending specimen back due to PCP team being out of office at time of call

## 2022-08-11 NOTE — Addendum Note (Signed)
Addended by: Dyann Kief on: 08/11/2022 10:34 AM   Modules accepted: Orders

## 2022-08-11 NOTE — Assessment & Plan Note (Signed)
Check A1c. 

## 2022-08-11 NOTE — Addendum Note (Signed)
Addended by: Beryle Lathe on: 08/11/2022 12:22 PM   Modules accepted: Orders

## 2022-08-11 NOTE — Assessment & Plan Note (Signed)
He has been started on Truvada since his last visit.  He would like for [place] to take over this prescription.  This is reasonable.  We will check labs today.  Does not need refill today.  He will come back in 3 months to recheck labs.  He will let me know when he needs refill.

## 2022-08-12 ENCOUNTER — Other Ambulatory Visit: Payer: Self-pay

## 2022-08-12 DIAGNOSIS — Z0001 Encounter for general adult medical examination with abnormal findings: Secondary | ICD-10-CM

## 2022-08-12 DIAGNOSIS — Z113 Encounter for screening for infections with a predominantly sexual mode of transmission: Secondary | ICD-10-CM

## 2022-08-12 DIAGNOSIS — Z7251 High risk heterosexual behavior: Secondary | ICD-10-CM

## 2022-08-12 LAB — RPR: RPR Ser Ql: NONREACTIVE

## 2022-08-12 LAB — URINE CYTOLOGY ANCILLARY ONLY
Chlamydia: NEGATIVE
Comment: NEGATIVE
Comment: NORMAL
Neisseria Gonorrhea: NEGATIVE

## 2022-08-12 LAB — HIV ANTIBODY (ROUTINE TESTING W REFLEX): HIV 1&2 Ab, 4th Generation: NONREACTIVE

## 2022-08-12 NOTE — Addendum Note (Signed)
Addended by: Dyann Kief on: 08/12/2022 10:55 AM   Modules accepted: Orders

## 2022-08-15 NOTE — Progress Notes (Signed)
Please inform patient of the following:  Labs are all normal. We can recheck the STD tests in 3 months and everything else in a year.  Manuel Smith. [name], MD 08/15/2022 1:11 PM

## 2022-08-16 LAB — C. TRACHOMATIS/N. GONORRHOEAE RNA

## 2022-08-16 LAB — TIQ-NTM

## 2022-09-18 ENCOUNTER — Encounter: Payer: Self-pay | Admitting: Family Medicine

## 2022-09-19 ENCOUNTER — Other Ambulatory Visit: Payer: Self-pay

## 2022-09-19 MED ORDER — EMTRICITABINE-TENOFOVIR DF 200-300 MG PO TABS: 1.0000 | ORAL_TABLET | Freq: Every day | ORAL | 1 refills | Status: DC

## 2022-11-15 ENCOUNTER — Ambulatory Visit: Payer: BC Managed Care – PPO | Admitting: Family Medicine

## 2022-11-15 ENCOUNTER — Other Ambulatory Visit: Payer: BC Managed Care – PPO

## 2022-11-15 ENCOUNTER — Other Ambulatory Visit (HOSPITAL_COMMUNITY)
Admission: RE | Admit: 2022-11-15 | Discharge: 2022-11-15 | Disposition: A | Discharge status: Home / Self Care | Payer: BC Managed Care – PPO | Source: Ambulatory Visit | Attending: Family Medicine | Admitting: Family Medicine

## 2022-11-15 DIAGNOSIS — Z7251 High risk heterosexual behavior: Secondary | ICD-10-CM

## 2022-11-16 LAB — URINE CYTOLOGY ANCILLARY ONLY
Chlamydia: NEGATIVE
Comment: NEGATIVE
Comment: NORMAL
Neisseria Gonorrhea: NEGATIVE

## 2022-11-16 LAB — HIV ANTIBODY (ROUTINE TESTING W REFLEX): HIV 1&2 Ab, 4th Generation: NONREACTIVE

## 2022-11-16 LAB — RPR: RPR Ser Ql: NONREACTIVE

## 2022-11-17 NOTE — Progress Notes (Signed)
Please inform patient of the following:  STI tests are all negative. HE can continue with Prep. Okay to send in new rx if needed.  Katina Degree. [name], MD 11/17/2022 3:18 PM

## 2022-11-23 ENCOUNTER — Other Ambulatory Visit: Payer: Self-pay | Admitting: *Deleted

## 2022-11-23 MED ORDER — EMTRICITABINE-TENOFOVIR DF 200-300 MG PO TABS: 1.0000 | ORAL_TABLET | Freq: Every day | ORAL | 1 refills | Status: DC

## 2023-02-14 ENCOUNTER — Other Ambulatory Visit (HOSPITAL_COMMUNITY)
Admission: RE | Admit: 2023-02-14 | Discharge: 2023-02-14 | Disposition: A | Discharge status: Home / Self Care | Payer: BC Managed Care – PPO | Source: Ambulatory Visit | Attending: Family Medicine | Admitting: Family Medicine

## 2023-02-14 ENCOUNTER — Ambulatory Visit: Payer: BC Managed Care – PPO | Admitting: Family Medicine

## 2023-02-14 ENCOUNTER — Other Ambulatory Visit: Payer: BC Managed Care – PPO

## 2023-02-14 DIAGNOSIS — Z113 Encounter for screening for infections with a predominantly sexual mode of transmission: Secondary | ICD-10-CM

## 2023-02-14 DIAGNOSIS — Z7251 High risk heterosexual behavior: Secondary | ICD-10-CM | POA: Insufficient documentation

## 2023-02-14 DIAGNOSIS — Z0001 Encounter for general adult medical examination with abnormal findings: Secondary | ICD-10-CM

## 2023-02-15 LAB — URINE CYTOLOGY ANCILLARY ONLY
Chlamydia: NEGATIVE
Comment: NEGATIVE
Comment: NORMAL
Neisseria Gonorrhea: NEGATIVE

## 2023-02-15 LAB — HIV ANTIBODY (ROUTINE TESTING W REFLEX): HIV 1&2 Ab, 4th Generation: NONREACTIVE

## 2023-02-15 LAB — RPR: RPR Ser Ql: NONREACTIVE

## 2023-02-16 NOTE — Progress Notes (Signed)
Please inform patient of the following:  STD testing all negative. We can recheck in 3 months.  Manuel Smith. [name], MD 02/16/2023 12:59 PM

## 2023-05-16 ENCOUNTER — Ambulatory Visit (INDEPENDENT_AMBULATORY_CARE_PROVIDER_SITE_OTHER): Payer: BC Managed Care – PPO | Admitting: Family Medicine

## 2023-05-16 ENCOUNTER — Encounter: Payer: Self-pay | Admitting: Family Medicine

## 2023-05-16 ENCOUNTER — Other Ambulatory Visit (HOSPITAL_COMMUNITY)
Admission: RE | Admit: 2023-05-16 | Discharge: 2023-05-16 | Disposition: A | Discharge status: Home / Self Care | Payer: BC Managed Care – PPO | Source: Ambulatory Visit | Attending: Family Medicine | Admitting: Family Medicine

## 2023-05-16 VITALS — BP 130/79 | HR 67 | Temp 97.3°F | Ht 76.0 in | Wt 217.0 lb

## 2023-05-16 DIAGNOSIS — Z7251 High risk heterosexual behavior: Secondary | ICD-10-CM | POA: Insufficient documentation

## 2023-05-16 NOTE — Progress Notes (Signed)
Patient presented today for lab visit.  He was not seen or evaluated by me.

## 2023-05-17 LAB — URINE CYTOLOGY ANCILLARY ONLY
Chlamydia: NEGATIVE
Comment: NEGATIVE
Comment: NORMAL
Neisseria Gonorrhea: NEGATIVE

## 2023-05-17 LAB — RPR: RPR Ser Ql: NONREACTIVE

## 2023-05-17 LAB — HIV ANTIBODY (ROUTINE TESTING W REFLEX): HIV 1&2 Ab, 4th Generation: NONREACTIVE

## 2023-05-18 NOTE — Progress Notes (Signed)
Great news! Labs are all NORMAL. We can recheck in 3 months.  Manuel Smith. [name], MD 05/18/2023 8:15 AM

## 2023-06-18 ENCOUNTER — Encounter: Payer: Self-pay | Admitting: Family Medicine

## 2023-06-20 ENCOUNTER — Other Ambulatory Visit: Payer: Self-pay

## 2023-06-20 DIAGNOSIS — Z7251 High risk heterosexual behavior: Secondary | ICD-10-CM

## 2023-06-20 MED ORDER — EMTRICITABINE-TENOFOVIR DF 200-300 MG PO TABS: 1.0000 | ORAL_TABLET | Freq: Every day | ORAL | 1 refills | Status: DC

## 2023-06-20 NOTE — Telephone Encounter (Signed)
Please refill his Truvada.   Can we clarify what he means by PEP? The postexposure prophylaxis medication is the same as a preexposure prophylaxis medication.  There is nothing else we can add on.  He can schedule appointment to discuss a few issues.  Katina Degree. [name], MD 06/20/2023 12:28 PM

## 2023-07-13 ENCOUNTER — Encounter (INDEPENDENT_AMBULATORY_CARE_PROVIDER_SITE_OTHER): Payer: Self-pay

## 2023-08-14 ENCOUNTER — Other Ambulatory Visit (HOSPITAL_COMMUNITY)
Admission: RE | Admit: 2023-08-14 | Discharge: 2023-08-14 | Disposition: A | Discharge status: Home / Self Care | Payer: BC Managed Care – PPO | Source: Ambulatory Visit | Attending: Family Medicine | Admitting: Family Medicine

## 2023-08-14 ENCOUNTER — Ambulatory Visit (INDEPENDENT_AMBULATORY_CARE_PROVIDER_SITE_OTHER): Payer: BC Managed Care – PPO | Admitting: Family Medicine

## 2023-08-14 VITALS — BP 130/80 | HR 73 | Temp 98.0°F | Ht 76.0 in | Wt 222.4 lb

## 2023-08-14 DIAGNOSIS — Z0001 Encounter for general adult medical examination with abnormal findings: Secondary | ICD-10-CM | POA: Diagnosis not present

## 2023-08-14 DIAGNOSIS — Z0000 Encounter for general adult medical examination without abnormal findings: Secondary | ICD-10-CM | POA: Diagnosis not present

## 2023-08-14 DIAGNOSIS — R739 Hyperglycemia, unspecified: Secondary | ICD-10-CM | POA: Diagnosis not present

## 2023-08-14 DIAGNOSIS — Z7251 High risk heterosexual behavior: Secondary | ICD-10-CM

## 2023-08-14 DIAGNOSIS — Z23 Encounter for immunization: Secondary | ICD-10-CM

## 2023-08-14 DIAGNOSIS — E785 Hyperlipidemia, unspecified: Secondary | ICD-10-CM

## 2023-08-14 DIAGNOSIS — E559 Vitamin D deficiency, unspecified: Secondary | ICD-10-CM

## 2023-08-14 DIAGNOSIS — R5383 Other fatigue: Secondary | ICD-10-CM | POA: Diagnosis not present

## 2023-08-14 LAB — COMPREHENSIVE METABOLIC PANEL
ALT: 54 U/L — ABNORMAL HIGH (ref 0–53)
AST: 30 U/L (ref 0–37)
Albumin: 4.1 g/dL (ref 3.5–5.2)
Alkaline Phosphatase: 62 U/L (ref 39–117)
BUN: 15 mg/dL (ref 6–23)
CO2: 32 mEq/L (ref 19–32)
Calcium: 9.2 mg/dL (ref 8.4–10.5)
Chloride: 102 meq/L (ref 96–112)
Creatinine, Ser: 1.06 mg/dL (ref 0.40–1.50)
GFR: 89.68 mL/min (ref >60.00)
Glucose, Bld: 98 mg/dL (ref 70–99)
Potassium: 4.3 meq/L (ref 3.5–5.1)
Sodium: 140 meq/L (ref 135–145)
Total Bilirubin: 0.9 mg/dL (ref 0.2–1.2)
Total Protein: 6.8 g/dL (ref 6.0–8.3)

## 2023-08-14 LAB — LIPID PANEL
Cholesterol: 137 mg/dL (ref 0–200)
HDL: 48.1 mg/dL (ref >39.00)
LDL Cholesterol: 71 mg/dL (ref 0–99)
NonHDL: 88.75
Total CHOL/HDL Ratio: 3
Triglycerides: 89 mg/dL (ref 0.0–149.0)
VLDL: 17.8 mg/dL (ref 0.0–40.0)

## 2023-08-14 LAB — CBC
HCT: 44.2 % (ref 39.0–52.0)
Hemoglobin: 14.7 g/dL (ref 13.0–17.0)
MCHC: 33.3 g/dL (ref 30.0–36.0)
MCV: 96.4 fl (ref 78.0–100.0)
Platelets: 237 10*3/uL (ref 150.0–400.0)
RBC: 4.59 Mil/uL (ref 4.22–5.81)
RDW: 12.5 % (ref 11.5–15.5)
WBC: 3.8 10*3/uL — ABNORMAL LOW (ref 4.0–10.5)

## 2023-08-14 LAB — VITAMIN D 25 HYDROXY (VIT D DEFICIENCY, FRACTURES): VITD: 23.5 ng/mL — ABNORMAL LOW (ref 30.00–100.00)

## 2023-08-14 LAB — TSH: TSH: 3.66 u[IU]/mL (ref 0.35–5.50)

## 2023-08-14 LAB — HEMOGLOBIN A1C: Hgb A1c MFr Bld: 5.3 % (ref 4.6–6.5)

## 2023-08-14 LAB — TESTOSTERONE: Testosterone: 416.5 ng/dL (ref 300.00–890.00)

## 2023-08-14 MED ORDER — DOXYCYCLINE HYCLATE 100 MG PO TABS: 200.0000 mg | ORAL_TABLET | ORAL | 3 refills | Status: DC | PRN

## 2023-08-14 NOTE — Assessment & Plan Note (Signed)
On Truvada.  Tolerating well.  Check labs today.  He is interested in doxycycline pep for prophylaxis of bacterial infection.  He would be a good candidate for this.  We discussed potential side effects of doxycycline.  Will send in prescription today for doxycycline take 200 mg by mouth as needed within 24 hours of unprotected intercourse.  He will continue Truvada.

## 2023-08-14 NOTE — Assessment & Plan Note (Signed)
Check A1c.

## 2023-08-14 NOTE — Progress Notes (Signed)
Chief Complaint:  Manuel Smith is a 37 y.o. male who presents today for his annual comprehensive physical exam.    Assessment/Plan:  Chronic Problems Addressed Today: Vitamin D deficiency Check vitamin D.   Dyslipidemia Discussed lifestyle modifications.  Check lipids.  Hyperglycemia Check A1c.  High risk sexual behavior On Truvada.  Tolerating well.  Check labs today.  He is interested in doxycycline pep for prophylaxis of bacterial infection.  He would be a good candidate for this.  We discussed potential side effects of doxycycline.  Will send in prescription today for doxycycline take 200 mg by mouth as needed within 24 hours of unprotected intercourse.  He will continue Truvada.  Preventative Healthcare: Check labs.  Flu shot given today.  Up-to-date on other vaccines and screenings.  Patient Counseling(The following topics were reviewed and/or handout was given):  -Nutrition: Stressed importance of moderation in sodium/caffeine intake, saturated fat and cholesterol, caloric balance, sufficient intake of fresh fruits, vegetables, and fiber.  -Stressed the importance of regular exercise.   -Substance Abuse: Discussed cessation/primary prevention of tobacco, alcohol, or other drug use; driving or other dangerous activities under the influence; availability of treatment for abuse.   -Injury prevention: Discussed safety belts, safety helmets, smoke detector, smoking near bedding or upholstery.   -Sexuality: Discussed sexually transmitted diseases, partner selection, use of condoms, avoidance of unintended pregnancy and contraceptive alternatives.   -Dental health: Discussed importance of regular tooth brushing, flossing, and dental visits.  -Health maintenance and immunizations reviewed. Please refer to Health maintenance section.  Return to care in 1 year for next preventative visit.     Subjective:  HPI:  He has no acute complaints today. See A/P for status of chronic  conditions.  He is interested in Doxy PEP for HIV prevention for prevention of bacterial STDs.  Lifestyle Diet: Trying to eat more fruits and vegetables.  Exercise: Working out 5 times per week. Weight training and cardiology.      05/16/2023    9:55 AM  Depression screen PHQ 2/9  Decreased Interest 0  Down, Depressed, Hopeless 0  PHQ - 2 Score 0    There are no preventive care reminders to display for this patient.   ROS: Per HPI, otherwise a complete review of systems was negative.   PMH:  The following were reviewed and entered/updated in epic: Past Medical History:  Diagnosis Date   H. 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 pylori infection 06/15/2018   No past surgical history on file.  Family History  Problem Relation Age of Onset   Uterine cancer Mother    Liver cancer Maternal Grandfather    Stomach cancer Neg Hx    Colon cancer Neg Hx    Pancreatic cancer Neg Hx     Medications- reviewed and updated Current Outpatient Medications  Medication Sig Dispense Refill   doxycycline (VIBRA-TABS) 100 MG tablet Take 2 tablets (200 mg total) by mouth as needed. Take within 24 hours of unprotected intercourse. 30 tablet 3   emtricitabine-tenofovir (TRUVADA) 200-300 MG tablet Take 1 tablet by mouth daily. 90 tablet 1   No current facility-administered medications for this visit.    Allergies-reviewed and updated  No Known Allergies  Social History   Socioeconomic History   Marital status: Single    Spouse name: Not on file   Number of children: Not on file   Years of education: Not on file   Highest education level: Not on file  Occupational History    Employer: [employer]  Tobacco  Use   Smoking status: Never   Smokeless tobacco: Never  Vaping Use   Vaping status: Never Used  Substance and Sexual Activity   Alcohol use: Never   Drug use: Never   Sexual activity: Not on file  Other Topics Concern   Not on file  Social History Narrative   Single, no children.   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 Wt Readings from Last 3 Encounters:  08/14/23 222 lb 6.4 oz (100.9 kg)  05/16/23 217 lb (98.4 kg)  08/11/22 209 lb 9.6 oz (95.1 kg)   Gen: NAD, resting comfortably HEENT: TMs normal bilaterally. OP clear. No thyromegaly noted.  CV: RRR with no murmurs appreciated Pulm: NWOB, CTAB with no crackles, wheezes, or rhonchi GI: Normal bowel sounds present. Soft, Nontender, Nondistended. MSK: no edema, cyanosis, or clubbing noted Skin: warm, dry Neuro: CN2-12 grossly intact. Strength 5/5 in upper and lower extremities. Reflexes symmetric and intact bilaterally.  Psych: Normal affect and thought content     Manuel Mertz M. [name], MD 08/14/2023 8:13 AM

## 2023-08-14 NOTE — Assessment & Plan Note (Signed)
Check vitamin D.

## 2023-08-14 NOTE — Assessment & Plan Note (Signed)
Discussed lifestyle modifications. Check lipids.  ?

## 2023-08-14 NOTE — Patient Instructions (Addendum)
It was very nice to see you today!  We will check blood work today.  I will send in doxycycline.  Please take 200 mg within 24 hours of unprotected intercourse.  Please continue to work on diet and exercise.  Return in about 1 year (around 08/13/2024) for Annual Physical.   Take care, Dr [name]  PLEASE NOTE:  If you had any lab tests, please let us know if you have not heard back within a few days. You may see your results on mychart before we have a chance to review them but we will give you a call once they are reviewed by [place].   If we ordered any referrals today, please let us know if you have not heard from their office within the next week.   If you had any urgent prescriptions sent in today, please check with the pharmacy within an hour of our visit to make sure the prescription was transmitted appropriately.   Please try these tips to maintain a healthy lifestyle:  Eat at least 3 REAL meals and 1-2 snacks per day.  Aim for no more than 5 hours between eating.  If you eat breakfast, please do so within one hour of getting up.   Each meal should contain half fruits/vegetables, one quarter protein, and one quarter carbs (no bigger than a computer mouse)  Cut down on sweet beverages. This includes juice, soda, and sweet tea.   Drink at least 1 glass of water with each meal and aim for at least 8 glasses per day  Exercise at least 150 minutes every week.    Preventive Care 9-34 Years Old, Male Preventive care refers to lifestyle choices and visits with your health care provider that can promote health and wellness. Preventive care visits are also called wellness exams. What can I expect for my preventive care visit? Counseling During your preventive care visit, your health care provider may ask about your: Medical history, including: Past medical problems. Family medical history. Current health, including: Emotional well-being. Home life and relationship  well-being. Sexual activity. Lifestyle, including: Alcohol, nicotine or tobacco, and drug use. Access to firearms. Diet, exercise, and sleep habits. Safety issues such as seatbelt and bike helmet use. Sunscreen use. Work and work Astronomer. Physical exam Your health care provider may check your: Height and weight. These may be used to calculate your BMI (body mass index). BMI is a measurement that tells if you are at a healthy weight. Waist circumference. This measures the distance around your waistline. This measurement also tells if you are at a healthy weight and may help predict your risk of certain diseases, such as type 2 diabetes and high blood pressure. Heart rate and blood pressure. Body temperature. Skin for abnormal spots. What immunizations do I need?  Vaccines are usually given at various ages, according to a schedule. Your health care provider will recommend vaccines for you based on your age, medical history, and lifestyle or other factors, such as travel or where you work. What tests do I need? Screening Your health care provider may recommend screening tests for certain conditions. This may include: Lipid and cholesterol levels. Diabetes screening. This is done by checking your blood sugar (glucose) after you have not eaten for a while (fasting). Hepatitis B test. Hepatitis C test. HIV (human immunodeficiency virus) test. STI (sexually transmitted infection) testing, if you are at risk. Talk with your health care provider about your test results, treatment options, and if necessary, the need for more  tests. Follow these instructions at home: Eating and drinking  Eat a healthy diet that includes fresh fruits and vegetables, whole grains, lean protein, and low-fat dairy products. Drink enough fluid to keep your urine pale yellow. Take vitamin and mineral supplements as recommended by your health care provider. Do not drink alcohol if your health care provider tells  you not to drink. If you drink alcohol: Limit how much you have to 0-2 drinks a day. Know how much alcohol is in your drink. In the [place] one drink equals one 12 oz bottle of beer (355 mL), one 5 oz glass of wine (148 mL), or one 1 oz glass of hard liquor (44 mL). Lifestyle Brush your teeth every morning and night with fluoride toothpaste. Floss one time each day. Exercise for at least 30 minutes 5 or more days each week. Do not use any products that contain nicotine or tobacco. These products include cigarettes, chewing tobacco, and vaping devices, such as e-cigarettes. If you need help quitting, ask your health care provider. Do not use drugs. If you are sexually active, practice safe sex. Use a condom or other form of protection to prevent STIs. Find healthy ways to manage stress, such as: Meditation, yoga, or listening to music. Journaling. Talking to a trusted person. Spending time with friends and family. Minimize exposure to UV radiation to reduce your risk of skin cancer. Safety Always wear your seat belt while driving or riding in a vehicle. Do not drive: If you have been drinking alcohol. Do not ride with someone who has been drinking. If you have been using any mind-altering substances or drugs. While texting. When you are tired or distracted. Wear a helmet and other protective equipment during sports activities. If you have firearms in your house, make sure you follow all gun safety procedures. Seek help if you have been physically or sexually abused. What's next? Go to your health care provider once a year for an annual wellness visit. Ask your health care provider how often you should have your eyes and teeth checked. Stay up to date on all vaccines. This information is not intended to replace advice given to you by your health care provider. Make sure you discuss any questions you have with your health care provider. Document Revised: 05/12/2021 Document Reviewed:  05/12/2021 Elsevier Patient Education  2024 [employer].

## 2023-08-15 LAB — URINE CYTOLOGY ANCILLARY ONLY
Chlamydia: NEGATIVE
Comment: NEGATIVE
Comment: NORMAL
Neisseria Gonorrhea: NEGATIVE

## 2023-08-15 LAB — RPR: RPR Ser Ql: NONREACTIVE

## 2023-08-15 LAB — HIV ANTIBODY (ROUTINE TESTING W REFLEX): HIV 1&2 Ab, 4th Generation: NONREACTIVE

## 2023-08-16 NOTE — Progress Notes (Signed)
Vitamin D is a little low but the rest of his labs are all at baseline.  He can start over-the-counter supplementation 1000 to 2000 IUs daily and we can recheck in 3 to 6 months.  He should keep up the good work with diet and exercise and we can recheck everything else in year.

## 2023-11-02 ENCOUNTER — Emergency Department (HOSPITAL_COMMUNITY)
Admission: EM | Admit: 2023-11-02 | Discharge: 2023-11-03 | Disposition: A | Discharge status: Home / Self Care | Payer: Worker's Compensation | Attending: Emergency Medicine | Admitting: Emergency Medicine

## 2023-11-02 ENCOUNTER — Other Ambulatory Visit: Payer: Self-pay

## 2023-11-02 DIAGNOSIS — S61011A Laceration without foreign body of right thumb without damage to nail, initial encounter: Secondary | ICD-10-CM | POA: Diagnosis present

## 2023-11-02 DIAGNOSIS — Y990 Civilian activity done for income or pay: Secondary | ICD-10-CM | POA: Insufficient documentation

## 2023-11-02 DIAGNOSIS — W25XXXA Contact with sharp glass, initial encounter: Secondary | ICD-10-CM | POA: Diagnosis not present

## 2023-11-02 NOTE — ED Triage Notes (Signed)
Pt reports he was at work and cut his right thumb on a piece of glass just prior to arrival. Pt unsure of last tetanus shot.

## 2023-11-03 DIAGNOSIS — S61011A Laceration without foreign body of right thumb without damage to nail, initial encounter: Secondary | ICD-10-CM | POA: Diagnosis not present

## 2023-11-03 MED ORDER — LIDOCAINE HCL (PF) 1 % IJ SOLN
30.0000 mL | Freq: Once | INTRAMUSCULAR | Status: AC
  Administered 2023-11-03: 30 mL
  Filled 2023-11-03: qty 30

## 2023-11-03 NOTE — ED Provider Notes (Signed)
[facility]EMERGENCY DEPT [facility] Emergency Department Provider Note MRN:  [id number]  Arrival date & time: 11/03/23     Chief Complaint   Laceration   History of Present Illness   Manuel Smith is a 37 y.o. year-old male presents to the ED with chief complaint of laceration to the right thumb.  He reports that he was cut with a shard of glass at work.  Bleeding controlled prior to arrival with gauze.  Denies any other injuries..  History provided by patient.   Review of Systems  Pertinent positive and negative review of systems noted in HPI.    Physical Exam   Vitals:   11/02/23 2308 11/03/23 0227  BP: (!) 169/87 130/78  Pulse: 73 71  Resp: 18 18  Temp: 98.8 F (37.1 C) 98.5 F (36.9 C)  SpO2: 100% 100%    CONSTITUTIONAL:  non toxic-appearing, NAD NEURO:  Alert and oriented x 3, CN 3-12 grossly intact EYES:  eyes equal and reactive ENT/NECK:  Supple, no stridor  CARDIO:  appears well-perfused  PULM:  No respiratory distress,  GI/GU:  non-distended,  MSK/SPINE:  No gross deformities, no edema, moves all extremities  SKIN:  no rash, atraumatic, 2 cm semi circular laceration to the right thumb, no FB   *Additional and/or pertinent findings included in MDM below  Diagnostic and Interventional Summary    EKG Interpretation Date/Time:    Ventricular Rate:    PR Interval:    QRS Duration:    QT Interval:    QTC Calculation:   R Axis:      Text Interpretation:         Labs Reviewed - No data to display  No orders to display    Medications  lidocaine (PF) (XYLOCAINE) 1 % injection 30 mL (30 mLs Infiltration Given by Other 11/03/23 0232)     Procedures  /  Critical Care .Laceration Repair  Date/Time: 11/03/2023 3:39 AM  Performed by: Roxy Horseman, PA-C Authorized by: Roxy Horseman, PA-C   Consent:    Consent obtained:  Verbal   Consent given by:  Patient   Risks, benefits, and alternatives were discussed: yes     Risks discussed:   Infection, pain, poor cosmetic result, poor wound healing, need for additional repair and retained foreign body   Alternatives discussed:  No treatment Universal protocol:    Procedure explained and questions answered to patient or proxy's satisfaction: yes     Relevant documents present and verified: yes     Test results available: yes     Imaging studies available: yes     Required blood products, implants, devices, and special equipment available: yes     Site/side marked: yes     Immediately prior to procedure, a time out was called: yes     Patient identity confirmed:  Verbally with patient Anesthesia:    Anesthesia method:  Nerve block   Block location:  Digital block   Block needle gauge:  27 G   Block anesthetic:  Lidocaine 1% w/o epi   Block technique:  Ring   Block injection procedure:  Anatomic landmarks identified, introduced needle, incremental injection, negative aspiration for blood and anatomic landmarks palpated   Block outcome:  Anesthesia achieved Laceration details:    Length (cm):  2 Pre-procedure details:    Preparation:  Patient was prepped and draped in usual sterile fashion Exploration:    Limited defect created (wound extended): no     Wound extent: areolar tissue not violated,  fascia not violated, no foreign body, no signs of injury, no nerve damage, no tendon damage and no vascular damage     Contaminated: no   Treatment:    Area cleansed with:  Povidone-iodine   Amount of cleaning:  Standard   Irrigation solution:  Sterile saline Skin repair:    Repair method:  Sutures   Suture size:  4-0   Suture technique:  Simple interrupted   Number of sutures:  5 Approximation:    Approximation:  Close Repair type:    Repair type:  Simple Post-procedure details:    Dressing:  Non-adherent dressing   Procedure completion:  Tolerated well, no immediate complications   ED Course and Medical Decision Making  I have reviewed the triage vital signs, the  nursing notes, and pertinent available records from the EMR.  Social Determinants Affecting Complexity of Care: Patient has no clinically significant social determinants affecting this chief complaint..   ED Course:    Medical Decision Making Patient with laceration of the right thumb.  There was no foreign body.  Bleeding was controlled.  Laceration repaired at bedside.  Return precautions discussed.  Risk Prescription drug management.         Consultants: No consultations were needed in caring for this patient.   Treatment and Plan: Emergency department workup does not suggest an emergent condition requiring admission or immediate intervention beyond  what has been performed at this time. The patient is safe for discharge and has  been instructed to return immediately for worsening symptoms, change in  symptoms or any other concerns    Final Clinical Impressions(s) / ED Diagnoses     ICD-10-CM   1. Laceration of right thumb without foreign body without damage to nail, initial encounter  S61.011A       ED Discharge Orders     None         Discharge Instructions Discussed with and Provided to Patient:   Discharge Instructions   None      Roxy Horseman, PA-C 11/03/23 0341    Terald Sleeper, MD 11/03/23 [phone]

## 2023-11-06 ENCOUNTER — Telehealth: Payer: Self-pay | Admitting: Family Medicine

## 2023-11-06 NOTE — Telephone Encounter (Signed)
Patient states he needs to schedule labs to be completed since he has this done every four months. Pt states this is done because he is taking truvada. Please place lab orders.

## 2023-11-06 NOTE — Telephone Encounter (Signed)
Ok with me. Please place any necessary orders. 

## 2023-11-06 NOTE — Telephone Encounter (Signed)
Please advise 

## 2023-11-07 ENCOUNTER — Other Ambulatory Visit: Payer: Self-pay

## 2023-11-07 DIAGNOSIS — Z7251 High risk heterosexual behavior: Secondary | ICD-10-CM

## 2023-11-07 DIAGNOSIS — E559 Vitamin D deficiency, unspecified: Secondary | ICD-10-CM

## 2023-11-07 DIAGNOSIS — R739 Hyperglycemia, unspecified: Secondary | ICD-10-CM

## 2023-11-07 NOTE — Telephone Encounter (Signed)
Future lab orders placed for patient,please schedule lab only appt

## 2023-11-07 NOTE — Telephone Encounter (Signed)
Contacted pt and scheduled.

## 2023-11-07 NOTE — Telephone Encounter (Signed)
Please schedule pt for lab only appt; future labs orders placed

## 2023-11-14 ENCOUNTER — Other Ambulatory Visit: Payer: BC Managed Care – PPO

## 2023-11-15 ENCOUNTER — Other Ambulatory Visit (INDEPENDENT_AMBULATORY_CARE_PROVIDER_SITE_OTHER): Payer: BC Managed Care – PPO

## 2023-11-15 DIAGNOSIS — E559 Vitamin D deficiency, unspecified: Secondary | ICD-10-CM

## 2023-11-15 DIAGNOSIS — R739 Hyperglycemia, unspecified: Secondary | ICD-10-CM

## 2023-11-15 DIAGNOSIS — Z7251 High risk heterosexual behavior: Secondary | ICD-10-CM | POA: Diagnosis not present

## 2023-11-15 LAB — CBC WITH DIFFERENTIAL/PLATELET
Basophils Absolute: 0 10*3/uL (ref 0.0–0.1)
Basophils Relative: 0.5 % (ref 0.0–3.0)
Eosinophils Absolute: 0.1 10*3/uL (ref 0.0–0.7)
Eosinophils Relative: 1.8 % (ref 0.0–5.0)
HCT: 44.3 % (ref 39.0–52.0)
Hemoglobin: 14.8 g/dL (ref 13.0–17.0)
Lymphocytes Relative: 37.8 % (ref 12.0–46.0)
Lymphs Abs: 1.4 10*3/uL (ref 0.7–4.0)
MCHC: 33.4 g/dL (ref 30.0–36.0)
MCV: 97.5 fL (ref 78.0–100.0)
Monocytes Absolute: 0.3 10*3/uL (ref 0.1–1.0)
Monocytes Relative: 6.9 % (ref 3.0–12.0)
Neutro Abs: 2 10*3/uL (ref 1.4–7.7)
Neutrophils Relative %: 53 % (ref 43.0–77.0)
Platelets: 221 10*3/uL (ref 150.0–400.0)
RBC: 4.54 Mil/uL (ref 4.22–5.81)
RDW: 12.4 % (ref 11.5–15.5)
WBC: 3.7 10*3/uL — ABNORMAL LOW (ref 4.0–10.5)

## 2023-11-15 LAB — COMPREHENSIVE METABOLIC PANEL
ALT: 26 U/L (ref 0–53)
AST: 23 U/L (ref 0–37)
Albumin: 4.3 g/dL (ref 3.5–5.2)
Alkaline Phosphatase: 81 U/L (ref 39–117)
BUN: 18 mg/dL (ref 6–23)
CO2: 30 meq/L (ref 19–32)
Calcium: 9 mg/dL (ref 8.4–10.5)
Chloride: 105 meq/L (ref 96–112)
Creatinine, Ser: 0.92 mg/dL (ref 0.40–1.50)
GFR: 106.11 mL/min (ref >60.00)
Glucose, Bld: 108 mg/dL — ABNORMAL HIGH (ref 70–99)
Potassium: 3.9 meq/L (ref 3.5–5.1)
Sodium: 140 meq/L (ref 135–145)
Total Bilirubin: 0.4 mg/dL (ref 0.2–1.2)
Total Protein: 7.1 g/dL (ref 6.0–8.3)

## 2023-11-15 LAB — VITAMIN D 25 HYDROXY (VIT D DEFICIENCY, FRACTURES): VITD: 24.57 ng/mL — ABNORMAL LOW (ref 30.00–100.00)

## 2023-11-15 LAB — HEMOGLOBIN A1C: Hgb A1c MFr Bld: 5.2 % (ref 4.6–6.5)

## 2023-11-17 NOTE — Progress Notes (Signed)
Vitamin D is still low.  Recommend 5000 IUs daily.  Recheck in 3 to 6 months.  The rest of his labs are all stable.  He was supposed to have HIV antibody drawn with these labs.  Can we have him come back to do this or add on if needed?

## 2023-11-20 ENCOUNTER — Other Ambulatory Visit: Payer: Self-pay | Admitting: *Deleted

## 2023-11-20 DIAGNOSIS — Z7251 High risk heterosexual behavior: Secondary | ICD-10-CM

## 2023-11-23 ENCOUNTER — Other Ambulatory Visit: Payer: BC Managed Care – PPO

## 2023-11-23 DIAGNOSIS — Z7251 High risk heterosexual behavior: Secondary | ICD-10-CM

## 2023-11-24 LAB — HIV ANTIBODY (ROUTINE TESTING W REFLEX): HIV 1&2 Ab, 4th Generation: NONREACTIVE

## 2023-11-27 NOTE — Progress Notes (Signed)
HIV negative.  We should repeat in 3 months.

## 2023-11-28 ENCOUNTER — Other Ambulatory Visit: Payer: Self-pay | Admitting: *Deleted

## 2023-11-28 DIAGNOSIS — Z7251 High risk heterosexual behavior: Secondary | ICD-10-CM

## 2024-03-02 ENCOUNTER — Other Ambulatory Visit: Payer: Self-pay | Admitting: Family Medicine

## 2024-03-02 DIAGNOSIS — Z7251 High risk heterosexual behavior: Secondary | ICD-10-CM

## 2024-03-07 ENCOUNTER — Other Ambulatory Visit

## 2024-03-07 DIAGNOSIS — Z7251 High risk heterosexual behavior: Secondary | ICD-10-CM

## 2024-03-08 ENCOUNTER — Encounter: Payer: Self-pay | Admitting: Family Medicine

## 2024-03-08 LAB — HIV ANTIBODY (ROUTINE TESTING W REFLEX): HIV 1&2 Ab, 4th Generation: NONREACTIVE

## 2024-03-08 NOTE — Progress Notes (Signed)
 HIV test negative. We can repeat in 3 months.  Manuel Smith. [name], MD 03/08/2024 2:55 PM

## 2024-06-02 ENCOUNTER — Other Ambulatory Visit: Payer: Self-pay | Admitting: Family Medicine

## 2024-06-02 DIAGNOSIS — Z7251 High risk heterosexual behavior: Secondary | ICD-10-CM

## 2024-06-14 ENCOUNTER — Telehealth: Payer: Self-pay | Admitting: Family Medicine

## 2024-06-14 ENCOUNTER — Other Ambulatory Visit: Payer: Self-pay | Admitting: Family Medicine

## 2024-06-14 DIAGNOSIS — Z7251 High risk heterosexual behavior: Secondary | ICD-10-CM

## 2024-06-14 NOTE — Telephone Encounter (Signed)
 Ok with me. Please place any necessary orders.

## 2024-06-14 NOTE — Telephone Encounter (Signed)
 Lab orders have been placed. Please cal patient to schedule lab appointment.

## 2024-06-14 NOTE — Telephone Encounter (Signed)
 Please review patient request and advise.    Copied from CRM [id number]. Topic: Clinical - Request for Lab/Test Order >> Jun 14, 2024  2:17 PM Manuel Smith wrote: Reason for CRM: Patient is calling to request a lab order of his prep medication. Patient states that he typically gets his blood drawn every few months and denied a appointment with Dr. Kennyth. Patient requesting a call back once labs has been ordered so he can schedule a lab appointment.

## 2024-06-17 ENCOUNTER — Other Ambulatory Visit (INDEPENDENT_AMBULATORY_CARE_PROVIDER_SITE_OTHER)

## 2024-06-17 DIAGNOSIS — Z7251 High risk heterosexual behavior: Secondary | ICD-10-CM

## 2024-06-18 LAB — HIV ANTIBODY (ROUTINE TESTING W REFLEX): HIV 1&2 Ab, 4th Generation: NONREACTIVE

## 2024-06-19 ENCOUNTER — Ambulatory Visit: Payer: Self-pay | Admitting: Family Medicine

## 2024-06-19 NOTE — Progress Notes (Signed)
 HIV is negative.   Worth HERO. Kennyth, MD 06/19/2024 11:10 AM

## 2024-08-06 ENCOUNTER — Encounter: Payer: Self-pay | Admitting: Family Medicine

## 2024-08-08 ENCOUNTER — Other Ambulatory Visit: Payer: Self-pay | Admitting: Family Medicine

## 2024-08-08 DIAGNOSIS — Z7251 High risk heterosexual behavior: Secondary | ICD-10-CM

## 2024-08-08 MED ORDER — EMTRICITABINE-TENOFOVIR DF 200-300 MG PO TABS: 1.0000 | ORAL_TABLET | Freq: Every day | ORAL | 0 refills | Status: DC

## 2024-08-08 MED ORDER — DOXYCYCLINE HYCLATE 100 MG PO TABS: 200.0000 mg | ORAL_TABLET | ORAL | 0 refills | Status: DC | PRN

## 2024-08-08 NOTE — Telephone Encounter (Signed)
 Copied from CRM [id number]. Topic: Clinical - Medication Refill >> Aug 08, 2024 11:04 AM Mia F wrote: Medication: emtricitabine -tenofovir  (TRUVADA) 200-300 MG tablet  doxycycline  (VIBRA -TABS) 100 MG tablet   Has the patient contacted their pharmacy? No (Agent: If no, request that the patient contact the pharmacy for the refill. If patient does not wish to contact the pharmacy document the reason why and proceed with request.) (Agent: If yes, when and what did the pharmacy advise?)  This is the patient's preferred pharmacy:  [facility] [address] [place] - [address] [place] [address] Phone: [phone] Fax: [phone]  Is this the correct pharmacy for this prescription? Yes If no, delete pharmacy and type the correct one.   Has the prescription been filled recently? Yes  Is the patient out of the medication? No  Has the patient been seen for an appointment in the last year OR does the patient have an upcoming appointment? Yes  Can we respond through MyChart? Yes  Agent: Please be advised that Rx refills may take up to 3 business days. We ask that you follow-up with your pharmacy.

## 2024-08-16 ENCOUNTER — Ambulatory Visit (INDEPENDENT_AMBULATORY_CARE_PROVIDER_SITE_OTHER): Admitting: Family Medicine

## 2024-08-16 ENCOUNTER — Encounter: Payer: Self-pay | Admitting: Family Medicine

## 2024-08-16 ENCOUNTER — Other Ambulatory Visit (HOSPITAL_COMMUNITY)
Admission: RE | Admit: 2024-08-16 | Discharge: 2024-08-16 | Disposition: A | Discharge status: Home / Self Care | Source: Ambulatory Visit | Attending: Family Medicine | Admitting: Family Medicine

## 2024-08-16 VITALS — BP 115/73 | HR 63 | Temp 97.3°F | Ht 76.0 in | Wt 216.0 lb

## 2024-08-16 DIAGNOSIS — R5383 Other fatigue: Secondary | ICD-10-CM

## 2024-08-16 DIAGNOSIS — E559 Vitamin D deficiency, unspecified: Secondary | ICD-10-CM | POA: Diagnosis not present

## 2024-08-16 DIAGNOSIS — Z23 Encounter for immunization: Secondary | ICD-10-CM | POA: Diagnosis not present

## 2024-08-16 DIAGNOSIS — Z0001 Encounter for general adult medical examination with abnormal findings: Secondary | ICD-10-CM

## 2024-08-16 DIAGNOSIS — E785 Hyperlipidemia, unspecified: Secondary | ICD-10-CM | POA: Diagnosis not present

## 2024-08-16 DIAGNOSIS — F419 Anxiety disorder, unspecified: Secondary | ICD-10-CM | POA: Diagnosis not present

## 2024-08-16 DIAGNOSIS — Z7251 High risk heterosexual behavior: Secondary | ICD-10-CM | POA: Insufficient documentation

## 2024-08-16 DIAGNOSIS — R739 Hyperglycemia, unspecified: Secondary | ICD-10-CM

## 2024-08-16 LAB — COMPREHENSIVE METABOLIC PANEL WITH GFR
ALT: 25 U/L (ref 0–53)
AST: 22 U/L (ref 0–37)
Albumin: 4.2 g/dL (ref 3.5–5.2)
Alkaline Phosphatase: 71 U/L (ref 39–117)
BUN: 17 mg/dL (ref 6–23)
CO2: 33 meq/L — ABNORMAL HIGH (ref 19–32)
Calcium: 9.3 mg/dL (ref 8.4–10.5)
Chloride: 104 meq/L (ref 96–112)
Creatinine, Ser: 1.01 mg/dL (ref 0.40–1.50)
GFR: 94.36 mL/min (ref >60.00)
Glucose, Bld: 103 mg/dL — ABNORMAL HIGH (ref 70–99)
Potassium: 4.2 meq/L (ref 3.5–5.1)
Sodium: 142 meq/L (ref 135–145)
Total Bilirubin: 0.5 mg/dL (ref 0.2–1.2)
Total Protein: 6.7 g/dL (ref 6.0–8.3)

## 2024-08-16 LAB — VITAMIN B12: Vitamin B-12: 459 pg/mL (ref 211–911)

## 2024-08-16 LAB — HEMOGLOBIN A1C: Hgb A1c MFr Bld: 5.6 % (ref 4.6–6.5)

## 2024-08-16 LAB — CBC
HCT: 41.2 % (ref 39.0–52.0)
Hemoglobin: 14.1 g/dL (ref 13.0–17.0)
MCHC: 34.2 g/dL (ref 30.0–36.0)
MCV: 94.5 fl (ref 78.0–100.0)
Platelets: 221 K/uL (ref 150.0–400.0)
RBC: 4.36 Mil/uL (ref 4.22–5.81)
RDW: 12.9 % (ref 11.5–15.5)
WBC: 3.5 K/uL — ABNORMAL LOW (ref 4.0–10.5)

## 2024-08-16 LAB — LIPID PANEL
Cholesterol: 147 mg/dL (ref 0–200)
HDL: 40.1 mg/dL (ref >39.00)
LDL Cholesterol: 87 mg/dL (ref 0–99)
NonHDL: 106.73
Total CHOL/HDL Ratio: 4
Triglycerides: 98 mg/dL (ref 0.0–149.0)
VLDL: 19.6 mg/dL (ref 0.0–40.0)

## 2024-08-16 LAB — TSH: TSH: 3.45 u[IU]/mL (ref 0.35–5.50)

## 2024-08-16 LAB — TESTOSTERONE: Testosterone: 382.95 ng/dL (ref 300.00–890.00)

## 2024-08-16 LAB — VITAMIN D 25 HYDROXY (VIT D DEFICIENCY, FRACTURES): VITD: 29.98 ng/mL — ABNORMAL LOW (ref 30.00–100.00)

## 2024-08-16 NOTE — Patient Instructions (Signed)
 It was very nice to see you today!  VISIT SUMMARY: Today, you came in for your annual physical exam and vaccination updates. We discussed your low energy levels, routine blood work, STD screening, and vaccination needs.  YOUR PLAN: LOW ENERGY: You have been feeling low energy, which may be due to your work schedule and sleep pattern. -We will check your vitamin D , B12, and testosterone  levels with a blood test.  STD SCREENING: You want to continue regular screening for sexually transmitted diseases, including HIV. -We will perform a urine test for STDs.  VACCINATIONS: You need updates on your vaccinations. -We will administer the flu vaccine today. -We will start the HPV vaccine series today.  Return in about 1 year (around 08/16/2025) for Annual Physical.   Take care, Dr Kennyth  PLEASE NOTE:  If you had any lab tests, please let us  know if you have not heard back within a few days. You may see your results on mychart before we have a chance to review them but we will give you a call once they are reviewed by us .   If we ordered any referrals today, please let us  know if you have not heard from their office within the next week.   If you had any urgent prescriptions sent in today, please check with the pharmacy within an hour of our visit to make sure the prescription was transmitted appropriately.   Please try these tips to maintain a healthy lifestyle:  Eat at least 3 REAL meals and 1-2 snacks per day.  Aim for no more than 5 hours between eating.  If you eat breakfast, please do so within one hour of getting up.   Each meal should contain half fruits/vegetables, one quarter protein, and one quarter carbs (no bigger than a computer mouse)  Cut down on sweet beverages. This includes juice, soda, and sweet tea.   Drink at least 1 glass of water with each meal and aim for at least 8 glasses per day  Exercise at least 150 minutes every week.    Preventive Care 6-50 Years Old,  Male Preventive care refers to lifestyle choices and visits with your health care provider that can promote health and wellness. Preventive care visits are also called wellness exams. What can I expect for my preventive care visit? Counseling During your preventive care visit, your health care provider may ask about your: Medical history, including: Past medical problems. Family medical history. Current health, including: Emotional well-being. Home life and relationship well-being. Sexual activity. Lifestyle, including: Alcohol, nicotine or tobacco, and drug use. Access to firearms. Diet, exercise, and sleep habits. Safety issues such as seatbelt and bike helmet use. Sunscreen use. Work and work Astronomer. Physical exam Your health care provider may check your: Height and weight. These may be used to calculate your BMI (body mass index). BMI is a measurement that tells if you are at a healthy weight. Waist circumference. This measures the distance around your waistline. This measurement also tells if you are at a healthy weight and may help predict your risk of certain diseases, such as type 2 diabetes and high blood pressure. Heart rate and blood pressure. Body temperature. Skin for abnormal spots. What immunizations do I need?  Vaccines are usually given at various ages, according to a schedule. Your health care provider will recommend vaccines for you based on your age, medical history, and lifestyle or other factors, such as travel or where you work. What tests do I need? Screening  Your health care provider may recommend screening tests for certain conditions. This may include: Lipid and cholesterol levels. Diabetes screening. This is done by checking your blood sugar (glucose) after you have not eaten for a while (fasting). Hepatitis B test. Hepatitis C test. HIV (human immunodeficiency virus) test. STI (sexually transmitted infection) testing, if you are at risk. Talk  with your health care provider about your test results, treatment options, and if necessary, the need for more tests. Follow these instructions at home: Eating and drinking  Eat a healthy diet that includes fresh fruits and vegetables, whole grains, lean protein, and low-fat dairy products. Drink enough fluid to keep your urine pale yellow. Take vitamin and mineral supplements as recommended by your health care provider. Do not drink alcohol if your health care provider tells you not to drink. If you drink alcohol: Limit how much you have to 0-2 drinks a day. Know how much alcohol is in your drink. In the [place] one drink equals one 12 oz bottle of beer (355 mL), one 5 oz glass of wine (148 mL), or one 1 oz glass of hard liquor (44 mL). Lifestyle Brush your teeth every morning and night with fluoride toothpaste. Floss one time each day. Exercise for at least 30 minutes 5 or more days each week. Do not use any products that contain nicotine or tobacco. These products include cigarettes, chewing tobacco, and vaping devices, such as e-cigarettes. If you need help quitting, ask your health care provider. Do not use drugs. If you are sexually active, practice safe sex. Use a condom or other form of protection to prevent STIs. Find healthy ways to manage stress, such as: Meditation, yoga, or listening to music. Journaling. Talking to a trusted person. Spending time with friends and family. Minimize exposure to UV radiation to reduce your risk of skin cancer. Safety Always wear your seat belt while driving or riding in a vehicle. Do not drive: If you have been drinking alcohol. Do not ride with someone who has been drinking. If you have been using any mind-altering substances or drugs. While texting. When you are tired or distracted. Wear a helmet and other protective equipment during sports activities. If you have firearms in your house, make sure you follow all gun safety procedures. Seek  help if you have been physically or sexually abused. What's next? Go to your health care provider once a year for an annual wellness visit. Ask your health care provider how often you should have your eyes and teeth checked. Stay up to date on all vaccines. This information is not intended to replace advice given to you by your health care provider. Make sure you discuss any questions you have with your health care provider. Document Revised: 05/12/2021 Document Reviewed: 05/12/2021 Elsevier Patient Education  2024 [employer].

## 2024-08-16 NOTE — Assessment & Plan Note (Signed)
 Check vitamin D.

## 2024-08-16 NOTE — Progress Notes (Signed)
 Chief Complaint:  Manuel Smith is a 38 y.o. male who presents today for his annual comprehensive physical exam.    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  Chronic Problems Addressed Today: Dyslipidemia Check lipids.  Discussed lifestyle modifications.  Vitamin D  deficiency Check vitamin D .  Hyperglycemia Check A1c with labs.  High risk sexual behavior On Truvada and tolerating well.  Will check labs today.  Preventative Healthcare: Flu vaccine given today.   Patient Counseling(The following topics were reviewed and/or handout was given):  -Nutrition: Stressed importance of moderation in sodium/caffeine intake, saturated fat and cholesterol, caloric balance, sufficient intake of fresh fruits, vegetables, and fiber.  -Stressed the importance of regular exercise.   -Substance Abuse: Discussed cessation/primary prevention of tobacco, alcohol, or other drug use; driving or other dangerous activities under the influence; availability of treatment for abuse.   -Injury prevention: Discussed safety belts, safety helmets, smoke detector, smoking near bedding or upholstery.   -Sexuality: Discussed sexually transmitted diseases, partner selection, use of condoms, avoidance of unintended pregnancy and contraceptive alternatives.   -Dental health: Discussed importance of regular tooth brushing, flossing, and dental visits.  -Health maintenance and immunizations reviewed. Please refer to Health maintenance section.  Return to care in 1 year for next preventative visit.     Subjective:  HPI:  He has no acute complaints today. Patient is here today for his  annual physical.  See assessment / plan for status of chronic conditions.  Discussed the use of AI scribe software for clinical note transcription with the patient, who gave verbal consent to  proceed.  History of Present Illness Manuel Smith is a 38 year old male who presents for an annual physical exam and vaccination updates.  He has been experiencing low energy over the past week, which he attributes to extended work hours and altered sleep patterns.  He is interested in checking his vitamin D , B12, and testosterone  levels as part of his routine blood work. He has not experienced any side effects from his current medications and recently requested a refill.  He is trying to maintain a healthy diet and exercise routine, working out at the gym Monday to Friday with a mix of upper and lower body workouts and cardio. He is incorporating more fruits and vegetables into his diet.  He has not received the HPV vaccine previously and is interested in starting the series now that it is approved for his age group.     Lifestyle Diet: Balanced. Plenty of fruits and vegetables.  Exercise: Go to gym about 5 days per week.      08/16/2024    7:33 AM  Depression screen PHQ 2/9  Decreased Interest 0  Down, Depressed, Hopeless 0  PHQ - 2 Score 0    There are no preventive care reminders to display for this patient.   ROS: Per HPI, otherwise a complete review of systems was negative.   PMH:  The following were reviewed and entered/updated in epic: Past Medical History:  Diagnosis Date   H. 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  pylori infection 06/15/2018   History reviewed. No pertinent surgical history.  Family History  Problem Relation Age of Onset   Uterine cancer Mother    Liver cancer Maternal Grandfather    Stomach cancer Neg Hx    Colon cancer Neg Hx    Pancreatic cancer Neg Hx      Medications- reviewed and updated Current Outpatient Medications  Medication Sig Dispense Refill   emtricitabine -tenofovir  (TRUVADA) 200-300 MG tablet Take 1 tablet by mouth daily. 30 tablet 0   No current facility-administered medications for this visit.    Allergies-reviewed and updated No Known Allergies  Social History   Socioeconomic History   Marital status: Single    Spouse name: Not on file   Number of children: Not on file   Years of education: Not on file   Highest education level: Not on file  Occupational History    Employer: [employer]  Tobacco Use   Smoking status: Never   Smokeless tobacco: Never  Vaping Use   Vaping status: Never Used  Substance and Sexual Activity   Alcohol use: Never   Drug use: Never   Sexual activity: Not on file  Other Topics Concern   Not on file  Social History Narrative   Single, no children.   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 Wt Readings from Last 3 Encounters:  08/16/24 216 lb (98 kg)  11/02/23 220 lb (99.8 kg)  08/14/23 222 lb 6.4 oz (100.9 kg)   Gen: NAD, resting comfortably HEENT: TMs normal bilaterally. OP clear. No thyromegaly noted.  CV: RRR with no murmurs appreciated Pulm: NWOB, CTAB with no crackles, wheezes, or rhonchi GI: Normal bowel sounds present. Soft, Nontender, Nondistended. MSK: no edema, cyanosis, or clubbing noted Skin: warm, dry Neuro:  CN2-12 grossly intact. Strength 5/5 in upper and lower extremities. Reflexes symmetric and intact bilaterally.  Psych: Normal affect and thought content     Mariama Saintvil M. Kennyth, MD 08/16/2024 7:46 AM

## 2024-08-16 NOTE — Assessment & Plan Note (Signed)
Check A1c with labs. 

## 2024-08-16 NOTE — Assessment & Plan Note (Signed)
 On Truvada and tolerating well.  Will check labs today.

## 2024-08-16 NOTE — Assessment & Plan Note (Signed)
 Check lipids. Discussed lifestyle modifications.

## 2024-08-19 LAB — URINE CYTOLOGY ANCILLARY ONLY
Chlamydia: NEGATIVE
Comment: NEGATIVE
Comment: NORMAL
Neisseria Gonorrhea: NEGATIVE

## 2024-08-19 LAB — RPR: RPR Ser Ql: NONREACTIVE

## 2024-08-19 LAB — HIV ANTIBODY (ROUTINE TESTING W REFLEX)
HIV 1&2 Ab, 4th Generation: NONREACTIVE
HIV FINAL INTERPRETATION: NEGATIVE

## 2024-08-20 ENCOUNTER — Ambulatory Visit: Payer: Self-pay | Admitting: Family Medicine

## 2024-08-20 NOTE — Progress Notes (Signed)
 His vitamin D  is very minimally low but better than last time we checked.  He should continue supplementation 1 to 2000 IUs daily and we can recheck in 3 to 6 months.  His white blood cell count was mildly low but similar to his previous values.  We can monitor this with his next blood draw.  All of his other labs are at goal.  STD testing is all negative.  We should recheck again in 3 months.

## 2024-09-17 ENCOUNTER — Ambulatory Visit (INDEPENDENT_AMBULATORY_CARE_PROVIDER_SITE_OTHER)

## 2024-09-17 DIAGNOSIS — Z23 Encounter for immunization: Secondary | ICD-10-CM

## 2024-09-17 NOTE — Progress Notes (Signed)
 Pt came in on the Nurse schedule to receive 2nd HPV vaccine per Kennyth. Administered in the Rt deltoid arm without any complaints. Pt was advised to come back in 6mos to receive last dose. Pt understood.

## 2024-10-30 ENCOUNTER — Other Ambulatory Visit: Payer: Self-pay | Admitting: Family Medicine

## 2024-11-28 ENCOUNTER — Other Ambulatory Visit: Payer: Self-pay | Admitting: Family Medicine

## 2024-11-28 DIAGNOSIS — Z7251 High risk heterosexual behavior: Secondary | ICD-10-CM

## 2024-12-31 ENCOUNTER — Other Ambulatory Visit: Payer: Self-pay | Admitting: *Deleted

## 2024-12-31 ENCOUNTER — Telehealth: Payer: Self-pay | Admitting: *Deleted

## 2024-12-31 DIAGNOSIS — Z7251 High risk heterosexual behavior: Secondary | ICD-10-CM

## 2024-12-31 DIAGNOSIS — D729 Disorder of white blood cells, unspecified: Secondary | ICD-10-CM

## 2024-12-31 NOTE — Telephone Encounter (Signed)
 Copied from CRM [id number]. Topic: Clinical - Request for Lab/Test Order >> Dec 31, 2024  1:40 PM Paige D wrote: Reason for CRM: PT is calling to schedule for labs but no labs entered he states he does labs every 3 months for the medications he's on. Please place orders and then call pt to get scheduled   Please schedule a lab appt  Future lab order  [facility]

## 2025-01-01 NOTE — Telephone Encounter (Signed)
 Pt has been scheduled for lab only visit on 01/06/25 @ 9 am.

## 2025-01-06 ENCOUNTER — Other Ambulatory Visit

## 2025-02-13 ENCOUNTER — Ambulatory Visit

## 2025-08-18 ENCOUNTER — Encounter: Admitting: Family Medicine
